# Patient Record
Sex: Male | Born: 1990 | Race: Black or African American | Hispanic: No | Marital: Single | Smoking: Current some day smoker
Health system: Southern US, Community
[De-identification: ages and names within clinical notes are randomized; demographics above are authoritative.]

## PROBLEM LIST (undated history)

## (undated) ENCOUNTER — Emergency Department (HOSPITAL_BASED_OUTPATIENT_CLINIC_OR_DEPARTMENT_OTHER): Admission: EM | Discharge status: Against Medical Advice | Payer: Self-pay

## (undated) DIAGNOSIS — I10 Essential (primary) hypertension: Secondary | ICD-10-CM

## (undated) DIAGNOSIS — F909 Attention-deficit hyperactivity disorder, unspecified type: Secondary | ICD-10-CM

## (undated) DIAGNOSIS — F419 Anxiety disorder, unspecified: Secondary | ICD-10-CM

---
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
## 2010-04-20 ENCOUNTER — Emergency Department (HOSPITAL_COMMUNITY): Admission: EM | Admit: 2010-04-20 | Discharge: 2010-04-20 | Payer: Self-pay | Admitting: Emergency Medicine

## 2010-10-28 ENCOUNTER — Emergency Department (HOSPITAL_COMMUNITY): Admission: EM | Admit: 2010-10-28 | Discharge: 2010-10-28 | Payer: Self-pay | Admitting: Emergency Medicine

## 2010-11-03 ENCOUNTER — Emergency Department (HOSPITAL_COMMUNITY): Admission: EM | Admit: 2010-11-03 | Discharge: 2010-11-03 | Payer: Self-pay | Admitting: Emergency Medicine

## 2011-09-05 LAB — URINALYSIS, ROUTINE W REFLEX MICROSCOPIC
Glucose, UA: NEGATIVE
Hgb urine dipstick: NEGATIVE
Protein, ur: NEGATIVE
Urobilinogen, UA: 1

## 2011-09-05 LAB — DIFFERENTIAL
Basophils Absolute: 0
Basophils Relative: 1
Eosinophils Relative: 1
Monocytes Absolute: 0.6
Monocytes Relative: 9

## 2011-09-05 LAB — COMPREHENSIVE METABOLIC PANEL
ALT: 19
AST: 31
Albumin: 4.8
Alkaline Phosphatase: 59
Chloride: 102
Potassium: 5.1
Sodium: 136
Total Bilirubin: 1.3 — ABNORMAL HIGH
Total Protein: 7.2

## 2011-09-05 LAB — CBC
HCT: 45.5
Platelets: 209
RDW: 14
WBC: 7

## 2012-05-25 ENCOUNTER — Emergency Department (HOSPITAL_COMMUNITY)
Admission: EM | Admit: 2012-05-25 | Discharge: 2012-05-25 | Disposition: A | Discharge status: Home / Self Care | Payer: BC Managed Care – PPO | Attending: Emergency Medicine | Admitting: Emergency Medicine

## 2012-05-25 ENCOUNTER — Encounter (HOSPITAL_COMMUNITY): Payer: Self-pay | Admitting: Emergency Medicine

## 2012-05-25 DIAGNOSIS — F17200 Nicotine dependence, unspecified, uncomplicated: Secondary | ICD-10-CM | POA: Insufficient documentation

## 2012-05-25 DIAGNOSIS — K047 Periapical abscess without sinus: Secondary | ICD-10-CM | POA: Insufficient documentation

## 2012-05-25 MED ORDER — TRAMADOL HCL 50 MG PO TABS: 50.0000 mg | ORAL_TABLET | Freq: Four times a day (QID) | ORAL | Status: AC | PRN

## 2012-05-25 MED ORDER — PENICILLIN V POTASSIUM 500 MG PO TABS: 500.0000 mg | ORAL_TABLET | Freq: Three times a day (TID) | ORAL | Status: AC

## 2012-05-25 NOTE — Discharge Instructions (Signed)
Dental Abscess A dental abscess usually starts from an infected tooth. Antibiotic medicine and pain pills can be helpful, but dental infections require the attention of a dentist. Rinse around the infected area often with salt water (a pinch of salt in 8 oz of warm water). Do not apply heat to the outside of your face. See your dentist or oral surgeon as soon as possible.  SEEK IMMEDIATE MEDICAL CARE IF:  You have increasing, severe pain that is not relieved by medicine.   You or your child has an oral temperature above 102 F (38.9 C), not controlled by medicine.   Your baby is older than 3 months with a rectal temperature of 102 F (38.9 C) or higher.   Your baby is 3 months old or younger with a rectal temperature of 100.4 F (38 C) or higher.   You develop chills, severe headache, difficulty breathing, or trouble swallowing.   You have swelling in the neck or around the eye.  Document Released: 11/25/2005 Document Revised: 11/14/2011 Document Reviewed: 05/06/2007 [employer] Patient Information 2012 [employer]. 

## 2012-05-25 NOTE — ED Provider Notes (Signed)
History     CSN: [id number]  Arrival date & time 05/25/12  1353   First MD Initiated Contact with Patient 05/25/12 1540      4:57 PM HPI Patient reports for several days has had significant dental pain. Reports he has noticed a small dental abscess forming above is a left lateral upper incisor. Denies fever, difficulty breathing or swallowing. Reports already has an appointment scheduled with his dentist but he is unable to wait any longer due to severe pain.  Patient is a 21 y.o. male presenting with tooth pain. The history is provided by the patient.  Dental PainThe primary symptoms include mouth pain and oral lesions (Dental abscess). Primary symptoms do not include oral bleeding or headaches. The symptoms began 3 to 5 days ago. The symptoms are worsening. The symptoms are new. The symptoms occur constantly.  Additional symptoms include: gum swelling and gum tenderness. Additional symptoms do not include: dental sensitivity to temperature, purulent gums, trismus, jaw pain, facial swelling, trouble swallowing, pain with swallowing, ear pain and hearing loss.    History reviewed. No pertinent past medical history.  History reviewed. No pertinent past surgical history.  No family history on file.  History  Substance Use Topics  . Smoking status: Current Some Day Smoker    Types: Cigars  . Smokeless tobacco: Not on file  . Alcohol Use: No      Review of Systems  HENT: Positive for dental problem. Negative for hearing loss, ear pain, facial swelling, mouth sores, trouble swallowing and neck stiffness.        Dental abscess in tooth ache.    Eyes: Negative for photophobia and pain.  Neurological: Negative for weakness and headaches.  All other systems reviewed and are negative.    Allergies  Review of patient's allergies indicates no known allergies.  Home Medications  No current outpatient prescriptions on file.  BP 132/56  Pulse 64  Temp 98.4 F (36.9 C) (Oral)   Resp 16  SpO2 99%  Physical Exam  Constitutional: He is oriented to person, place, and time. He appears well-developed and well-nourished.  HENT:  Head: Normocephalic and atraumatic.  Mouth/Throat: Oropharynx is clear and moist. No oropharyngeal exudate.    Eyes: Pupils are equal, round, and reactive to light.  Neck: Normal range of motion. Neck supple.  Lymphadenopathy:    He has no cervical adenopathy.  Neurological: He is alert and oriented to person, place, and time.  Skin: Skin is warm and dry. No rash noted. No erythema. No pallor.  Psychiatric: He has a normal mood and affect. His behavior is normal.    ED Course  Dental Date/Time: 05/25/2012 4:52 PM Performed by: Thomasene Lot Authorized by: Thomasene Lot Consent: Verbal consent obtained. Consent given by: patient Patient understanding: patient states understanding of the procedure being performed Patient identity confirmed: verbally with patient Local anesthesia used: yes Anesthesia: nerve block Local anesthetic: bupivacaine 0.5% with epinephrine Anesthetic total: 3 ml Patient sedated: no Patient tolerance: Patient tolerated the procedure well with no immediate complications. Comments: Needle aspiration of dental abscess. Nerve block of the left anterior superior alveolar nerve.       MDM          Thomasene Lot, PA-C 05/25/12 1659

## 2012-05-25 NOTE — ED Notes (Signed)
Pt presenting to ed with c/o dental pain upper left side x 3 days pt states causing difficulty with eating

## 2012-05-26 NOTE — ED Provider Notes (Signed)
Medical screening examination/treatment/procedure(s) were performed by non-physician practitioner and as supervising physician I was immediately available for consultation/collaboration.   Hurman Horn, MD 05/26/12 [phone]

## 2012-08-17 ENCOUNTER — Encounter (HOSPITAL_COMMUNITY): Payer: Self-pay | Admitting: Family Medicine

## 2012-08-17 ENCOUNTER — Emergency Department (HOSPITAL_COMMUNITY)
Admission: EM | Admit: 2012-08-17 | Discharge: 2012-08-17 | Disposition: A | Discharge status: Home / Self Care | Payer: No Typology Code available for payment source | Attending: Emergency Medicine | Admitting: Emergency Medicine

## 2012-08-17 DIAGNOSIS — M546 Pain in thoracic spine: Secondary | ICD-10-CM | POA: Insufficient documentation

## 2012-08-17 DIAGNOSIS — F17200 Nicotine dependence, unspecified, uncomplicated: Secondary | ICD-10-CM | POA: Insufficient documentation

## 2012-08-17 MED ORDER — CYCLOBENZAPRINE HCL 10 MG PO TABS: 10.0000 mg | ORAL_TABLET | Freq: Two times a day (BID) | ORAL | Status: AC | PRN

## 2012-08-17 MED ORDER — IBUPROFEN 600 MG PO TABS: 600.0000 mg | ORAL_TABLET | Freq: Four times a day (QID) | ORAL | Status: AC | PRN

## 2012-08-17 MED ORDER — IBUPROFEN 800 MG PO TABS
800.0000 mg | ORAL_TABLET | Freq: Once | ORAL | Status: AC
  Administered 2012-08-17: 800 mg via ORAL
  Filled 2012-08-17: qty 1
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
## 2012-08-20 NOTE — ED Provider Notes (Signed)
Medical screening examination/treatment/procedure(s) were performed by non-physician practitioner and as supervising physician I was immediately available for consultation/collaboration.  Shelda Jakes, MD 08/20/12 2258

## 2012-12-15 ENCOUNTER — Emergency Department (HOSPITAL_COMMUNITY)
Admission: EM | Admit: 2012-12-15 | Discharge: 2012-12-15 | Disposition: A | Discharge status: Home / Self Care | Payer: Self-pay | Attending: Emergency Medicine | Admitting: Emergency Medicine

## 2012-12-15 ENCOUNTER — Encounter (HOSPITAL_COMMUNITY): Payer: Self-pay | Admitting: Emergency Medicine

## 2012-12-15 DIAGNOSIS — F17200 Nicotine dependence, unspecified, uncomplicated: Secondary | ICD-10-CM | POA: Insufficient documentation

## 2012-12-15 DIAGNOSIS — K047 Periapical abscess without sinus: Secondary | ICD-10-CM | POA: Insufficient documentation

## 2012-12-15 DIAGNOSIS — I10 Essential (primary) hypertension: Secondary | ICD-10-CM | POA: Insufficient documentation

## 2012-12-15 DIAGNOSIS — F909 Attention-deficit hyperactivity disorder, unspecified type: Secondary | ICD-10-CM | POA: Insufficient documentation

## 2012-12-15 HISTORY — DX: Essential (primary) hypertension: I10

## 2012-12-15 HISTORY — DX: Attention-deficit hyperactivity disorder, unspecified type: F90.9

## 2012-12-15 MED ORDER — OXYCODONE-ACETAMINOPHEN 5-325 MG PO TABS: ORAL_TABLET | ORAL | Status: DC

## 2012-12-15 MED ORDER — PENICILLIN V POTASSIUM 500 MG PO TABS: 500.0000 mg | ORAL_TABLET | Freq: Four times a day (QID) | ORAL | Status: AC

## 2012-12-15 NOTE — ED Notes (Addendum)
Patient complaining of left sided dental pain that started today; pain has radiated upwards and caused a headache.  Patient states that he has swelling around his gums.  Reports cracked tooth on left side.

## 2012-12-15 NOTE — ED Provider Notes (Signed)
History     CSN: [id number]  Arrival date & time 12/15/12  0056   First MD Initiated Contact with Patient 12/15/12 0101      Chief Complaint  Patient presents with  . Dental Pain    (Consider location/radiation/quality/duration/timing/severity/associated sxs/prior treatment) HPI  Patient with tooth pain worsening over the course of the day and also abscess to frontal upper jaw. Denies fever, nausea vomiting  Past Medical History  Diagnosis Date  . ADHD (attention deficit hyperactivity disorder)   . Hypertension     History reviewed. No pertinent past surgical history.  History reviewed. No pertinent family history.  History  Substance Use Topics  . Smoking status: Current Some Day Smoker    Types: Cigars  . Smokeless tobacco: Not on file  . Alcohol Use: Yes      Review of Systems  Constitutional: Negative for fever.  HENT: Positive for dental problem.   Respiratory: Negative for shortness of breath.   Cardiovascular: Negative for chest pain.  Gastrointestinal: Negative for nausea, vomiting, abdominal pain and diarrhea.  All other systems reviewed and are negative.    Allergies  Review of patient's allergies indicates no known allergies.  Home Medications  No current outpatient prescriptions on file.  BP 135/73  Pulse 74  Temp 98.1 F (36.7 C) (Oral)  Resp 16  SpO2 98%  Physical Exam  Nursing note and vitals reviewed. Constitutional: He is oriented to person, place, and time. He appears well-developed and well-nourished. No distress.  HENT:  Head: Normocephalic.  Mouth/Throat:    Eyes: Conjunctivae normal and EOM are normal.  Cardiovascular: Normal rate.   Pulmonary/Chest: Effort normal. No stridor.  Musculoskeletal: Normal range of motion.  Neurological: He is alert and oriented to person, place, and time.  Psychiatric: He has a normal mood and affect.    ED Course  Procedures (including critical care time)  INCISION AND  DRAINAGE Performed by: Wynetta Emery Consent: Verbal consent obtained. Risks and benefits: risks, benefits and alternatives were discussed Type: abscess  Body area: gum  Anesthesia: local infiltration  Incision was made with a scalpel.  Local anesthetic: lidocaine 2% without epinephrine  Anesthetic total: Half ml  Drainage: purulent  Drainage amount: 4   Packing material: none Patient tolerance: Patient tolerated the procedure well with no immediate complications.    Labs Reviewed - No data to display No results found.   1. Dental abscess       MDM   Pt verbalized understanding and agrees with care plan. Outpatient follow-up and return precautions given.    New Prescriptions   OXYCODONE-ACETAMINOPHEN (PERCOCET/ROXICET) 5-325 MG PER TABLET    1 to 2 tabs PO q6hrs  PRN for pain   PENICILLIN V POTASSIUM (VEETID) 500 MG TABLET    Take 1 tablet (500 mg total) by mouth 4 (four) times daily.          Wynetta Emery, PA-C 12/15/12 [phone]

## 2012-12-16 NOTE — ED Provider Notes (Signed)
Medical screening examination/treatment/procedure(s) were performed by non-physician practitioner and as supervising physician I was immediately available for consultation/collaboration.   Laray Anger, DO 12/16/12 1028

## 2013-01-18 ENCOUNTER — Encounter (HOSPITAL_COMMUNITY): Payer: Self-pay | Admitting: Emergency Medicine

## 2013-01-18 ENCOUNTER — Emergency Department (HOSPITAL_COMMUNITY)
Admission: EM | Admit: 2013-01-18 | Discharge: 2013-01-18 | Disposition: A | Discharge status: Home / Self Care | Payer: BC Managed Care – PPO | Attending: Emergency Medicine | Admitting: Emergency Medicine

## 2013-01-18 DIAGNOSIS — K0889 Other specified disorders of teeth and supporting structures: Secondary | ICD-10-CM

## 2013-01-18 DIAGNOSIS — Z8659 Personal history of other mental and behavioral disorders: Secondary | ICD-10-CM | POA: Insufficient documentation

## 2013-01-18 DIAGNOSIS — I10 Essential (primary) hypertension: Secondary | ICD-10-CM | POA: Insufficient documentation

## 2013-01-18 DIAGNOSIS — F17200 Nicotine dependence, unspecified, uncomplicated: Secondary | ICD-10-CM | POA: Insufficient documentation

## 2013-01-18 DIAGNOSIS — K089 Disorder of teeth and supporting structures, unspecified: Secondary | ICD-10-CM | POA: Insufficient documentation

## 2013-01-18 HISTORY — DX: Anxiety disorder, unspecified: F41.9

## 2013-01-18 MED ORDER — PERCOCET 5-325 MG PO TABS: 1.0000 | ORAL_TABLET | Freq: Four times a day (QID) | ORAL | Status: DC | PRN

## 2013-01-18 MED ORDER — PENICILLIN V POTASSIUM 500 MG PO TABS: 500.0000 mg | ORAL_TABLET | Freq: Four times a day (QID) | ORAL | Status: DC

## 2013-01-18 MED ORDER — OXYCODONE-ACETAMINOPHEN 5-325 MG PO TABS
1.0000 | ORAL_TABLET | Freq: Once | ORAL | Status: AC
  Administered 2013-01-18: 1 via ORAL
  Filled 2013-01-18: qty 1

## 2013-01-18 NOTE — ED Notes (Signed)
Pt states "something busted in my mouth last night" and then had severe pain this am in upper left bicuspid. Tooth is broken.

## 2013-01-18 NOTE — ED Provider Notes (Signed)
Medical screening examination/treatment/procedure(s) were performed by non-physician practitioner and as supervising physician I was immediately available for consultation/collaboration.   Laray Anger, DO 01/18/13 2111

## 2013-01-18 NOTE — ED Provider Notes (Signed)
History     CSN: [id number]  Arrival date & time 01/18/13  4098   First MD Initiated Contact with Patient 01/18/13 0804      No chief complaint on file.   (Consider location/radiation/quality/duration/timing/severity/associated sxs/prior treatment) HPI Comments: Patient presents to the emergency department with a dental complaint. Symptoms began yesterday. The patient has tried to alleviate pain with Tylenol.  Pain rated at a 10/10, characterized as throbbing in nature and located left upper incisor. Patient denies fever, night sweats, chills, difficulty swallowing or opening mouth, SOB, nuchal rigidity or decreased ROM of neck.  Patient does not have a dentist and requests a resource guide at discharge.   The history is provided by the patient.    Past Medical History  Diagnosis Date  . ADHD (attention deficit hyperactivity disorder)   . Hypertension   . Anxiety     History reviewed. No pertinent past surgical history.  No family history on file.  History  Substance Use Topics  . Smoking status: Current Some Day Smoker    Types: Cigars  . Smokeless tobacco: Not on file  . Alcohol Use: Yes      Review of Systems  Constitutional: Negative for fever, chills, diaphoresis and activity change.  HENT: Positive for dental problem. Negative for ear pain, sore throat, facial swelling, drooling, mouth sores, trouble swallowing, neck pain, neck stiffness, voice change, sinus pressure and tinnitus.   Eyes: Negative for pain and visual disturbance.  Respiratory: Negative for shortness of breath, wheezing and stridor.   Cardiovascular: Negative for chest pain.  Gastrointestinal: Negative for nausea and abdominal pain.  Musculoskeletal: Negative for myalgias.  Skin: Negative for rash.  Neurological: Negative for speech difficulty and headaches.  Hematological: Negative for adenopathy.  All other systems reviewed and are negative.    Allergies  Ibuprofen  Home Medications    Current Outpatient Rx  Name  Route  Sig  Dispense  Refill  . ibuprofen (ADVIL,MOTRIN) 200 MG tablet   Oral   Take 800 mg by mouth every 6 (six) hours as needed for pain (for pain).           BP 132/73  Pulse 84  Temp(Src) 97 F (36.1 C) (Oral)  SpO2 100%  Physical Exam  Nursing note and vitals reviewed. Constitutional: He is oriented to person, place, and time. He appears well-developed and well-nourished. No distress.  HENT:  Head: Normocephalic and atraumatic. No trismus in the jaw.  Mouth/Throat: Uvula is midline, oropharynx is clear and moist and mucous membranes are normal. Abnormal dentition. No dental abscesses or edematous. No oropharyngeal exudate, posterior oropharyngeal edema, posterior oropharyngeal erythema or tonsillar abscesses.  Poor dental hygiene. Pt able to open and close mouth with out difficulty. Airway intact. Uvula midline. Mild gingival swelling with tenderness over affected area, but no fluctuance. No swelling or tenderness of submental and submandibular regions.  Eyes: Conjunctivae and EOM are normal.  Neck: Normal range of motion and full passive range of motion without pain. Neck supple.  Cardiovascular: Normal rate and regular rhythm.   Pulmonary/Chest: Effort normal and breath sounds normal. No stridor. No respiratory distress. He has no wheezes.  Musculoskeletal: Normal range of motion.  Lymphadenopathy:       Head (right side): No submental, no submandibular, no tonsillar, no preauricular and no posterior auricular adenopathy present.       Head (left side): No submental, no submandibular, no tonsillar, no preauricular and no posterior auricular adenopathy present.    He has  no cervical adenopathy.  Neurological: He is alert and oriented to person, place, and time.  Skin: Skin is warm and dry. No rash noted. He is not diaphoretic.    ED Course  Dental Date/Time: 01/18/2013 8:21 AM Performed by: Jaci Carrel Authorized by: Jaci Carrel Consent: Verbal consent obtained. Risks and benefits: risks, benefits and alternatives were discussed Consent given by: patient Patient understanding: patient states understanding of the procedure being performed Patient consent: the patient's understanding of the procedure matches consent given Patient identity confirmed: verbally with patient and arm band Local anesthesia used: yes Local anesthetic: bupivacaine 0.5% with epinephrine Anesthetic total: 1.8 ml Patient sedated: no Comments: Dental block for pain   (including critical care time)  Labs Reviewed - No data to display No results found.   No diagnosis found.    MDM  Patient with toothache.  No gross abscess.  Exam unconcerning for Ludwig's angina or spread of infection. Dental block performed in ED for pain relief, tolerated well.  Will treat with penicillin and pain medicine.  Urged patient to follow-up with dentist.           Jaci Carrel, PA-C 01/18/13 [phone]

## 2013-02-14 ENCOUNTER — Encounter (HOSPITAL_COMMUNITY): Payer: Self-pay | Admitting: Emergency Medicine

## 2013-02-14 ENCOUNTER — Emergency Department (HOSPITAL_COMMUNITY): Payer: BC Managed Care – PPO

## 2013-02-14 ENCOUNTER — Emergency Department (HOSPITAL_COMMUNITY)
Admission: EM | Admit: 2013-02-14 | Discharge: 2013-02-14 | Disposition: A | Discharge status: Home / Self Care | Payer: BC Managed Care – PPO | Attending: Emergency Medicine | Admitting: Emergency Medicine

## 2013-02-14 DIAGNOSIS — F17200 Nicotine dependence, unspecified, uncomplicated: Secondary | ICD-10-CM | POA: Insufficient documentation

## 2013-02-14 DIAGNOSIS — S62307A Unspecified fracture of fifth metacarpal bone, left hand, initial encounter for closed fracture: Secondary | ICD-10-CM

## 2013-02-14 DIAGNOSIS — S62339A Displaced fracture of neck of unspecified metacarpal bone, initial encounter for closed fracture: Secondary | ICD-10-CM | POA: Insufficient documentation

## 2013-02-14 DIAGNOSIS — Z8781 Personal history of (healed) traumatic fracture: Secondary | ICD-10-CM | POA: Insufficient documentation

## 2013-02-14 DIAGNOSIS — Z8659 Personal history of other mental and behavioral disorders: Secondary | ICD-10-CM | POA: Insufficient documentation

## 2013-02-14 DIAGNOSIS — I10 Essential (primary) hypertension: Secondary | ICD-10-CM | POA: Insufficient documentation

## 2013-02-14 MED ORDER — HYDROCODONE-ACETAMINOPHEN 5-325 MG PO TABS: 1.0000 | ORAL_TABLET | ORAL | Status: DC | PRN

## 2013-02-14 MED ORDER — HYDROCODONE-ACETAMINOPHEN 5-325 MG PO TABS
2.0000 | ORAL_TABLET | Freq: Once | ORAL | Status: AC
  Administered 2013-02-14: 2 via ORAL
  Filled 2013-02-14: qty 2

## 2013-02-14 NOTE — Progress Notes (Signed)
Orthopedic Tech Progress Note Patient Details:  Bradley Arias 06/19/1991 [id number]  Ortho Devices Type of Ortho Device: Ace wrap;Ulna gutter splint Ortho Device/Splint Location: (L) UE   [name] 02/14/2013, 7:14 PM
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
## 2013-02-14 NOTE — ED Provider Notes (Signed)
Medical screening examination/treatment/procedure(s) were performed by non-physician practitioner and as supervising physician I was immediately available for consultation/collaboration.   Whitney Plunkett, MD 02/14/13 2314 
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
## 2013-02-15 ENCOUNTER — Emergency Department (HOSPITAL_COMMUNITY)
Admission: EM | Admit: 2013-02-15 | Discharge: 2013-02-15 | Disposition: A | Discharge status: Home / Self Care | Payer: BC Managed Care – PPO | Attending: Emergency Medicine | Admitting: Emergency Medicine

## 2013-02-15 ENCOUNTER — Encounter (HOSPITAL_COMMUNITY): Payer: Self-pay | Admitting: *Deleted

## 2013-02-15 DIAGNOSIS — F17200 Nicotine dependence, unspecified, uncomplicated: Secondary | ICD-10-CM | POA: Insufficient documentation

## 2013-02-15 DIAGNOSIS — I10 Essential (primary) hypertension: Secondary | ICD-10-CM | POA: Insufficient documentation

## 2013-02-15 DIAGNOSIS — K047 Periapical abscess without sinus: Secondary | ICD-10-CM | POA: Insufficient documentation

## 2013-02-15 DIAGNOSIS — F411 Generalized anxiety disorder: Secondary | ICD-10-CM | POA: Insufficient documentation

## 2013-02-15 DIAGNOSIS — F909 Attention-deficit hyperactivity disorder, unspecified type: Secondary | ICD-10-CM | POA: Insufficient documentation

## 2013-02-15 MED ORDER — AMOXICILLIN 500 MG PO CAPS: 1000.0000 mg | ORAL_CAPSULE | Freq: Two times a day (BID) | ORAL | Status: DC

## 2013-02-15 NOTE — ED Provider Notes (Signed)
History  This chart was scribed for Dione Booze, MD by Shari Heritage, ED Scribe. The patient was seen in room TR06C/TR06C. Patient's care was started at 1919.   CSN: [id number]  Arrival date & time 02/15/13  1613   First MD Initiated Contact with Patient 02/15/13 1919      Chief Complaint  Patient presents with  . Dental Pain     The history is provided by the patient. No language interpreter was used.    HPI Comments: Bradley Arias is a 22 y.o. male who presents to the Emergency Department complaining of dental abscess to the left upper gum. Patient states that he woke up with this problem today. He says that the abscess ruptured while here in the waiting room. There is no fever, chills, nausea or vomiting. He is a current some day smoker.    Past Medical History  Diagnosis Date  . ADHD (attention deficit hyperactivity disorder)   . Hypertension   . Anxiety     History reviewed. No pertinent past surgical history.  No family history on file.  History  Substance Use Topics  . Smoking status: Current Some Day Smoker    Types: Cigars  . Smokeless tobacco: Not on file  . Alcohol Use: Yes      Review of Systems  Constitutional: Negative for chills.  HENT: Positive for dental problem.   Gastrointestinal: Negative for nausea and vomiting.    Allergies  Ibuprofen  Home Medications   Current Outpatient Rx  Name  Route  Sig  Dispense  Refill  . HYDROcodone-acetaminophen (NORCO/VICODIN) 5-325 MG per tablet   Oral   Take 1 tablet by mouth every 4 (four) hours as needed for pain.           Triage Vitals: BP 124/76  Pulse 90  Temp(Src) 99.5 F (37.5 C) (Oral)  Resp 18  SpO2 94%  Physical Exam  Constitutional: He is oriented to person, place, and time. He appears well-developed and well-nourished.  HENT:  Head: Normocephalic and atraumatic.  Mouth/Throat: Dental abscesses present.  Site of recent abscess draining on gingiva between teeth 10 and 11. No  tenderness to percussion.  Neck: Normal range of motion. Neck supple.  Musculoskeletal: Normal range of motion.  Neurological: He is alert and oriented to person, place, and time.  Skin: Skin is warm and dry.  Psychiatric: He has a normal mood and affect. His behavior is normal.    ED Course  Procedures (including critical care time) DIAGNOSTIC STUDIES: Oxygen Saturation is 94% on room air, adequate by my interpretation.    COORDINATION OF CARE: 7:30 PM- Patient informed of current plan for treatment and evaluation and agrees with plan at this time.    1. Dental abscess       MDM  Dental abscess which has spontaneously drained. Old records are reviewed and he has 3 other ED visits for pain in the same tooth. He is referred to the on-call dentist for further evaluation to see if additional drainage is needed for possible root canal. He is discharged with prescription for amoxicillin.   I personally performed the services described in this documentation, which was scribed in my presence. The recorded information has been reviewed and is accurate.     Dione Booze, MD 02/15/13 [phone]

## 2013-02-15 NOTE — ED Notes (Signed)
Called with no response

## 2013-02-15 NOTE — ED Notes (Signed)
Called again with no response

## 2013-02-15 NOTE — ED Notes (Signed)
Pt st's he woke up this am with abscess above left upper tooth.  Pt st's it drained while he was in waiting room but tooth continues to be sore.

## 2013-02-15 NOTE — ED Notes (Signed)
Pt is here with left upper gum abscess

## 2013-02-16 ENCOUNTER — Telehealth (HOSPITAL_COMMUNITY): Payer: Self-pay | Admitting: Emergency Medicine

## 2013-02-16 NOTE — ED Notes (Signed)
Pharmav=cy called for clarification of Rx for Amoxicillin directions called in last night.  Order reviewed w/ RPh.

## 2013-02-16 NOTE — ED Notes (Signed)
Patient called 02/15/2013 stating that he had misplaced Rx for antibiotic and ask if we could call in. Rx : Amoxicillin 500 mg (2) capsules by mouth two times daily..called to pharmarcy.

## 2013-08-03 ENCOUNTER — Encounter (HOSPITAL_COMMUNITY): Payer: Self-pay | Admitting: *Deleted

## 2013-08-03 ENCOUNTER — Emergency Department (HOSPITAL_COMMUNITY)
Admission: EM | Admit: 2013-08-03 | Discharge: 2013-08-03 | Disposition: A | Discharge status: Home / Self Care | Payer: No Typology Code available for payment source | Attending: Emergency Medicine | Admitting: Emergency Medicine

## 2013-08-03 DIAGNOSIS — K0889 Other specified disorders of teeth and supporting structures: Secondary | ICD-10-CM

## 2013-08-03 DIAGNOSIS — F17200 Nicotine dependence, unspecified, uncomplicated: Secondary | ICD-10-CM | POA: Insufficient documentation

## 2013-08-03 DIAGNOSIS — I10 Essential (primary) hypertension: Secondary | ICD-10-CM | POA: Insufficient documentation

## 2013-08-03 DIAGNOSIS — Z8659 Personal history of other mental and behavioral disorders: Secondary | ICD-10-CM | POA: Insufficient documentation

## 2013-08-03 DIAGNOSIS — R220 Localized swelling, mass and lump, head: Secondary | ICD-10-CM | POA: Insufficient documentation

## 2013-08-03 DIAGNOSIS — K089 Disorder of teeth and supporting structures, unspecified: Secondary | ICD-10-CM | POA: Insufficient documentation

## 2013-08-03 MED ORDER — HYDROCODONE-ACETAMINOPHEN 5-325 MG PO TABS: 1.0000 | ORAL_TABLET | Freq: Four times a day (QID) | ORAL | Status: DC | PRN

## 2013-08-03 MED ORDER — PENICILLIN V POTASSIUM 500 MG PO TABS: 500.0000 mg | ORAL_TABLET | Freq: Four times a day (QID) | ORAL | Status: AC

## 2013-08-03 NOTE — ED Notes (Signed)
PT had abscess bust to left upper front tooth and patient states he is in agony.

## 2013-08-03 NOTE — ED Provider Notes (Signed)
CSN: [id number]     Arrival date & time 08/03/13  1324 History   First MD Initiated Contact with Patient 08/03/13 1344     Chief Complaint  Patient presents with  . Dental Pain   (Consider location/radiation/quality/duration/timing/severity/associated sxs/prior Treatment) HPI Comments: Patient presents with a chief complaint of dental pain.  He reports that the pain is located in the area of the left upper incisor tooth.  He reports that the pain worsened today.  This morning he noticed some swelling of the gingiva and reports that some yellow pus drained from the gingiva.  He has been taking Ibuprofen and Advil for the ain without relief.  He currently does not have a dentist.    Patient is a 22 y.o. male presenting with tooth pain. The history is provided by the patient.  Dental Pain Associated symptoms: gum swelling   Associated symptoms: no difficulty swallowing, no drooling, no facial pain, no facial swelling, no fever, no neck pain, no neck swelling and no trismus     Past Medical History  Diagnosis Date  . ADHD (attention deficit hyperactivity disorder)   . Hypertension   . Anxiety    History reviewed. No pertinent past surgical history. No family history on file. History  Substance Use Topics  . Smoking status: Current Some Day Smoker    Types: Cigars  . Smokeless tobacco: Not on file  . Alcohol Use: Yes    Review of Systems  Constitutional: Negative for fever.  HENT: Positive for dental problem. Negative for facial swelling, drooling and neck pain.   All other systems reviewed and are negative.    Allergies  Ibuprofen  Home Medications  No current outpatient prescriptions on file. BP 144/73  Pulse 70  Temp(Src) 98.6 F (37 C) (Oral)  Resp 18  SpO2 97% Physical Exam  Nursing note and vitals reviewed. Constitutional: He is oriented to person, place, and time. He appears well-developed and well-nourished. No distress.  HENT:  Head: Normocephalic and  atraumatic.  Mouth/Throat: Uvula is midline, oropharynx is clear and moist and mucous membranes are normal. No trismus in the jaw. Abnormal dentition. No dental abscesses or edematous. No oropharyngeal exudate, posterior oropharyngeal edema, posterior oropharyngeal erythema or tonsillar abscesses.  Poor dental hygiene. Pt able to open and close mouth with out difficulty. Airway intact. Uvula midline. Mild gingival swelling with tenderness over affected area, but no fluctuance. No swelling or tenderness of submental and submandibular regions.  Eyes: Conjunctivae and EOM are normal.  Neck: Normal range of motion and full passive range of motion without pain. Neck supple.  Cardiovascular: Normal rate, regular rhythm and normal heart sounds.   Pulmonary/Chest: Effort normal and breath sounds normal. No stridor. No respiratory distress. He has no wheezes.  Musculoskeletal: Normal range of motion.  Lymphadenopathy:       Head (right side): No submental, no submandibular, no tonsillar, no preauricular and no posterior auricular adenopathy present.       Head (left side): No submental, no submandibular, no tonsillar, no preauricular and no posterior auricular adenopathy present.    He has no cervical adenopathy.  Neurological: He is alert and oriented to person, place, and time.  Skin: Skin is warm and dry. No rash noted. He is not diaphoretic.    ED Course  Procedures (including critical care time) Labs Review Labs Reviewed - No data to display Imaging Review No results found.  MDM   1. Pain, dental    Patient with toothache.  No  gross abscess.  Exam unconcerning for Ludwig's angina or spread of infection.  Will treat with penicillin and pain medicine.  Urged patient to follow-up with dentist.      Pascal Lux Byers, PA-C 08/04/13 1208

## 2013-08-04 NOTE — ED Provider Notes (Signed)
Medical screening examination/treatment/procedure(s) were performed by non-physician practitioner and as supervising physician I was immediately available for consultation/collaboration.   Laray Anger, DO 08/04/13 2106

## 2013-10-07 ENCOUNTER — Encounter (HOSPITAL_COMMUNITY): Payer: Self-pay | Admitting: Emergency Medicine

## 2013-10-07 ENCOUNTER — Emergency Department (HOSPITAL_COMMUNITY)
Admission: EM | Admit: 2013-10-07 | Discharge: 2013-10-07 | Disposition: A | Discharge status: Home / Self Care | Payer: No Typology Code available for payment source | Attending: Emergency Medicine | Admitting: Emergency Medicine

## 2013-10-07 DIAGNOSIS — F17200 Nicotine dependence, unspecified, uncomplicated: Secondary | ICD-10-CM | POA: Insufficient documentation

## 2013-10-07 DIAGNOSIS — Z8659 Personal history of other mental and behavioral disorders: Secondary | ICD-10-CM | POA: Insufficient documentation

## 2013-10-07 DIAGNOSIS — K089 Disorder of teeth and supporting structures, unspecified: Secondary | ICD-10-CM | POA: Insufficient documentation

## 2013-10-07 DIAGNOSIS — K0889 Other specified disorders of teeth and supporting structures: Secondary | ICD-10-CM

## 2013-10-07 DIAGNOSIS — R109 Unspecified abdominal pain: Secondary | ICD-10-CM | POA: Insufficient documentation

## 2013-10-07 DIAGNOSIS — I10 Essential (primary) hypertension: Secondary | ICD-10-CM | POA: Insufficient documentation

## 2013-10-07 MED ORDER — HYDROCODONE-ACETAMINOPHEN 5-325 MG PO TABS: 1.0000 | ORAL_TABLET | Freq: Four times a day (QID) | ORAL | Status: DC | PRN

## 2013-10-07 MED ORDER — HYDROCODONE-ACETAMINOPHEN 5-325 MG PO TABS: 1.0000 | ORAL_TABLET | Freq: Four times a day (QID) | ORAL | Status: AC | PRN

## 2013-10-07 MED ORDER — AMOXICILLIN 500 MG PO CAPS: 500.0000 mg | ORAL_CAPSULE | Freq: Three times a day (TID) | ORAL | Status: DC

## 2013-10-07 MED ORDER — HYDROCODONE-ACETAMINOPHEN 5-325 MG PO TABS
1.0000 | ORAL_TABLET | Freq: Once | ORAL | Status: AC
  Administered 2013-10-07: 1 via ORAL
  Filled 2013-10-07: qty 1

## 2013-10-07 NOTE — ED Notes (Signed)
Pt c/o left upper dental pain x 3 days from broken tooth; pt sts some nausea due to pain

## 2013-10-07 NOTE — ED Provider Notes (Signed)
CSN: [id number]     Arrival date & time 10/07/13  0840 History   First MD Initiated Contact with Patient 10/07/13 [phone]     Chief Complaint  Patient presents with  . Dental Pain   (Consider location/radiation/quality/duration/timing/severity/associated sxs/prior Treatment) HPI Bradley Arias is a 22 y.o. male who presents emergency department complaining of abdominal pain. States he broke his tooth yesterday, states pain since then. Took Excedrin Migraine with no relief. States history of dental problems. States his called his dentist but could not get an appointment. Patient states he has history of abscesses and worried that this will become one. Patient denies any fever, chills, facial swelling. No other complaints.  Past Medical History  Diagnosis Date  . ADHD (attention deficit hyperactivity disorder)   . Hypertension   . Anxiety    History reviewed. No pertinent past surgical history. History reviewed. No pertinent family history. History  Substance Use Topics  . Smoking status: Current Some Day Smoker    Types: Cigars  . Smokeless tobacco: Not on file  . Alcohol Use: Yes    Review of Systems  Constitutional: Negative for fever and chills.  HENT: Positive for dental problem. Negative for mouth sores and sore throat.   Respiratory: Negative for cough, chest tightness and shortness of breath.   Cardiovascular: Negative for chest pain, palpitations and leg swelling.  Musculoskeletal: Negative for arthralgias, myalgias, neck pain and neck stiffness.  Skin: Negative for rash.  Allergic/Immunologic: Negative for immunocompromised state.  Neurological: Negative for dizziness and headaches.    Allergies  Ibuprofen  Home Medications  No current outpatient prescriptions on file. BP 143/69  Pulse 61  Temp(Src) 98 F (36.7 C) (Oral)  Resp 18  Ht 6\' 1"  (1.854 m)  Wt 175 lb (79.379 kg)  BMI 23.09 kg/m2  SpO2 98% Physical Exam  Nursing note and vitals  reviewed. Constitutional: He is oriented to person, place, and time. He appears well-developed and well-nourished. No distress.  HENT:  Head: Normocephalic.  No facial swelling. No obvious tooth fracture noted. Tenderness over left upper lateral incisor and over the surrounding gum. No obvious abscess. No trismus. No swelling under the tongue  Eyes: Conjunctivae are normal.  Neck: Neck supple.  Cardiovascular: Normal rate, regular rhythm and normal heart sounds.   Pulmonary/Chest: Effort normal and breath sounds normal. No respiratory distress. He has no wheezes. He has no rales.  Neurological: He is alert and oriented to person, place, and time.  Skin: Skin is warm and dry.    ED Course  Procedures (including critical care time) Labs Review Labs Reviewed - No data to display Imaging Review No results found.  EKG Interpretation   None       MDM   1. Pain, dental     Patient with a dental pain. At this time no obvious abscess. He does have a dentist but cannot get appointment this time. We'll start amoxicillin, Norco for pain, followup with [facility] as able.  Filed Vitals:   10/07/13 0853  BP: 143/69  Pulse: 61  Temp: 98 F (36.7 C)  TempSrc: Oral  Resp: 18  Height: 6\' 1"  (1.854 m)  Weight: 175 lb (79.379 kg)  SpO2: 98%       Lottie Mussel, PA-C 10/07/13 1516

## 2013-10-10 NOTE — ED Provider Notes (Signed)
Medical screening examination/treatment/procedure(s) were performed by non-physician practitioner and as supervising physician I was immediately available for consultation/collaboration.  Charistopher Rumble M Michaeal Davis, MD 10/10/13 1907 

## 2020-09-18 ENCOUNTER — Emergency Department (HOSPITAL_COMMUNITY)
Admission: EM | Admit: 2020-09-18 | Discharge: 2020-09-18 | Disposition: A | Discharge status: Home / Self Care | Payer: BC Managed Care – PPO | Attending: Emergency Medicine | Admitting: Emergency Medicine

## 2020-09-18 ENCOUNTER — Encounter (HOSPITAL_COMMUNITY): Payer: Self-pay

## 2020-09-18 DIAGNOSIS — I10 Essential (primary) hypertension: Secondary | ICD-10-CM | POA: Insufficient documentation

## 2020-09-18 DIAGNOSIS — R1032 Left lower quadrant pain: Secondary | ICD-10-CM | POA: Insufficient documentation

## 2020-09-18 DIAGNOSIS — R197 Diarrhea, unspecified: Secondary | ICD-10-CM | POA: Insufficient documentation

## 2020-09-18 DIAGNOSIS — K259 Gastric ulcer, unspecified as acute or chronic, without hemorrhage or perforation: Secondary | ICD-10-CM | POA: Insufficient documentation

## 2020-09-18 DIAGNOSIS — F17290 Nicotine dependence, other tobacco product, uncomplicated: Secondary | ICD-10-CM | POA: Insufficient documentation

## 2020-09-18 DIAGNOSIS — R1084 Generalized abdominal pain: Secondary | ICD-10-CM

## 2020-09-18 DIAGNOSIS — R112 Nausea with vomiting, unspecified: Secondary | ICD-10-CM | POA: Insufficient documentation

## 2020-09-18 LAB — COMPREHENSIVE METABOLIC PANEL
ALT: 19 U/L (ref 0–44)
AST: 21 U/L (ref 15–41)
Albumin: 5.2 g/dL — ABNORMAL HIGH (ref 3.5–5.0)
Alkaline Phosphatase: 77 U/L (ref 38–126)
Anion gap: 15 (ref 5–15)
BUN: 13 mg/dL (ref 6–20)
CO2: 28 mmol/L (ref 22–32)
Calcium: 10.3 mg/dL (ref 8.9–10.3)
Chloride: 98 mmol/L (ref 98–111)
Creatinine, Ser: 1.29 mg/dL — ABNORMAL HIGH (ref 0.61–1.24)
GFR, Estimated: >60 mL/min (ref >60)
Glucose, Bld: 109 mg/dL — ABNORMAL HIGH (ref 70–99)
Potassium: 4.3 mmol/L (ref 3.5–5.1)
Sodium: 141 mmol/L (ref 135–145)
Total Bilirubin: 1.4 mg/dL — ABNORMAL HIGH (ref 0.3–1.2)
Total Protein: 8.6 g/dL — ABNORMAL HIGH (ref 6.5–8.1)

## 2020-09-18 LAB — URINALYSIS, ROUTINE W REFLEX MICROSCOPIC
Bilirubin Urine: NEGATIVE
Glucose, UA: NEGATIVE mg/dL
Hgb urine dipstick: NEGATIVE
Ketones, ur: 80 mg/dL — AB
Nitrite: NEGATIVE
Protein, ur: 30 mg/dL — AB
Specific Gravity, Urine: 1.03 (ref 1.005–1.030)
WBC, UA: >50 WBC/hpf — ABNORMAL HIGH (ref 0–5)
pH: 5 (ref 5.0–8.0)

## 2020-09-18 LAB — CBC
HCT: 46.9 % (ref 39.0–52.0)
Hemoglobin: 15.9 g/dL (ref 13.0–17.0)
MCH: 31.9 pg (ref 26.0–34.0)
MCHC: 33.9 g/dL (ref 30.0–36.0)
MCV: 94 fL (ref 80.0–100.0)
Platelets: 289 10*3/uL (ref 150–400)
RBC: 4.99 MIL/uL (ref 4.22–5.81)
RDW: 14.2 % (ref 11.5–15.5)
WBC: 8.5 10*3/uL (ref 4.0–10.5)
nRBC: 0 % (ref 0.0–0.2)

## 2020-09-18 LAB — LIPASE, BLOOD: Lipase: 26 U/L (ref 11–51)

## 2020-09-18 LAB — ETHANOL: Alcohol, Ethyl (B): <10 mg/dL (ref <10)

## 2020-09-18 MED ORDER — DICYCLOMINE HCL 20 MG PO TABS: 20.0000 mg | ORAL_TABLET | Freq: Two times a day (BID) | ORAL | 0 refills | Status: AC

## 2020-09-18 MED ORDER — DICYCLOMINE HCL 10 MG/ML IM SOLN
20.0000 mg | Freq: Once | INTRAMUSCULAR | Status: AC
  Administered 2020-09-18: 20 mg via INTRAMUSCULAR
  Filled 2020-09-18: qty 2

## 2020-09-18 MED ORDER — SODIUM CHLORIDE 0.9 % IV BOLUS
1000.0000 mL | Freq: Once | INTRAVENOUS | Status: AC
  Administered 2020-09-18: 1000 mL via INTRAVENOUS

## 2020-09-18 MED ORDER — SUCRALFATE 1 GM/10ML PO SUSP
1.0000 g | Freq: Once | ORAL | Status: AC
  Administered 2020-09-18: 1 g via ORAL
  Filled 2020-09-18: qty 10

## 2020-09-18 MED ORDER — PANTOPRAZOLE SODIUM 40 MG IV SOLR
40.0000 mg | Freq: Once | INTRAVENOUS | Status: AC
  Administered 2020-09-18: 40 mg via INTRAVENOUS
  Filled 2020-09-18: qty 40

## 2020-09-18 MED ORDER — ONDANSETRON 4 MG PO TBDP: 4.0000 mg | ORAL_TABLET | Freq: Three times a day (TID) | ORAL | 0 refills | Status: DC | PRN

## 2020-09-18 NOTE — ED Provider Notes (Signed)
[facility]-EMERGENCY DEPT Provider Note   CSN: [id number] Arrival date & time: 09/18/20  0840     History Chief Complaint  Patient presents with  . Abdominal Pain    Bradley Arias is a 29 y.o. male.  HPI    Adult male with a history of gastric ulcers now presents with concern for left upper quadrant abdominal pain, nausea, vomiting, and diarrhea. He notes that he has previously been diagnosed with gastric ulcers, but has never seen a gastroenterologist in spite of being advised to do so. He currently takes no medication regularly for his episodic abdominal pain.  He also does not currently take his medicine for ADHD.  The patient recently relocated back to this area, home, and notes that over the past few days he has had more alcohol than usual.  During this time he has had pain in his left upper abdomen, epigastrium, sore, moderate. There is associated anorexia, nausea, vomiting, diarrhea. No fever, no dyspnea, chest pain. Minimal relief with Tums and Tylenol.    Past Medical History:  Diagnosis Date  . ADHD (attention deficit hyperactivity disorder)   . Anxiety   . Hypertension     Patient Active Problem List   Diagnosis Date Noted  . ADHD (attention deficit hyperactivity disorder)     History reviewed. No pertinent surgical history.     No family history on file.  Social History   Tobacco Use  . Smoking status: Current Some Day Smoker    Types: Cigars  . Smokeless tobacco: Never Used  Substance Use Topics  . Alcohol use: Yes  . Drug use: No    Home Medications Prior to Admission medications   Medication Sig Start Date End Date Taking? Authorizing Provider  amoxicillin (AMOXIL) 500 MG capsule Take 1 capsule (500 mg total) by mouth 3 (three) times daily. 10/07/13   Kirichenko, Lemont Fillers, PA-C  HYDROcodone-acetaminophen (NORCO) 5-325 MG per tablet Take 1 tablet by mouth every 6 (six) hours as needed for pain. 10/07/13    Kirichenko, Lemont Fillers, PA-C    Allergies    Ibuprofen  Review of Systems   Review of Systems  Constitutional:       Per HPI, otherwise negative  HENT:       Per HPI, otherwise negative  Respiratory:       Per HPI, otherwise negative  Cardiovascular:       Per HPI, otherwise negative  Gastrointestinal: Positive for abdominal pain, diarrhea, nausea and vomiting.  Endocrine:       Negative aside from HPI  Genitourinary:       Neg aside from HPI   Musculoskeletal:       Per HPI, otherwise negative  Skin: Negative.   Neurological: Negative for syncope.    Physical Exam Updated Vital Signs BP 130/88 (BP Location: Left Arm)   Pulse (!) 59   Temp 99.2 F (37.3 C) (Oral)   Resp 18   Ht 6\' 1"  (1.854 m)   Wt 79.4 kg   SpO2 100%   BMI 23.09 kg/m   Physical Exam Vitals and nursing note reviewed.  Constitutional:      General: He is not in acute distress.    Appearance: He is well-developed.  HENT:     Head: Normocephalic and atraumatic.  Eyes:     Conjunctiva/sclera: Conjunctivae normal.  Cardiovascular:     Rate and Rhythm: Normal rate and regular rhythm.  Pulmonary:     Effort: Pulmonary effort is normal. No respiratory  distress.     Breath sounds: No stridor.  Abdominal:     General: There is no distension.     Comments: Minimal tenderness to palpation, no guarding, no rebound.  Patient notes area of greatest discomfort in his left lateral, epigastric.  Skin:    General: Skin is warm and dry.  Neurological:     Mental Status: He is alert and oriented to person, place, and time.     ED Results / Procedures / Treatments   Labs (all labs ordered are listed, but only abnormal results are displayed) Labs Reviewed  COMPREHENSIVE METABOLIC PANEL - Abnormal; Notable for the following components:      Result Value   Glucose, Bld 109 (*)    Creatinine, Ser 1.29 (*)    Total Protein 8.6 (*)    Albumin 5.2 (*)    Total Bilirubin 1.4 (*)    All other components  within normal limits  LIPASE, BLOOD  CBC  ETHANOL  URINALYSIS, ROUTINE W REFLEX MICROSCOPIC    EKG None  Radiology No results found.  Procedures Procedures (including critical care time)  Medications Ordered in ED Medications  sodium chloride 0.9 % bolus 1,000 mL (1,000 mLs Intravenous New Bag/Given 09/18/20 0939)  sucralfate (CARAFATE) 1 GM/10ML suspension 1 g (1 g Oral Given 09/18/20 0939)  pantoprazole (PROTONIX) injection 40 mg (40 mg Intravenous Given 09/18/20 3419)    ED Course  I have reviewed the triage vital signs and the nursing notes.  Pertinent labs & imaging results that were available during my care of the patient were reviewed by me and considered in my medical decision making (see chart for details).  10:08 AM Labs notable for slight elevation in creatinine.  Patient has received fluids, Carafate, IV pantoprazole.  Update:, Patient in no distress.  I reviewed all findings, notable for absence of leukocytosis, substantial electrolyte abnormalities beyond evidence for dehydration.  1:51 PM Patient improved, sitting upright, notes his crampiness has improved.  Adult male reported history of gastric ulcers presents with upper abdominal pain, nausea, vomiting.  On here he is awake, alert, with soft, nonperitoneal abdomen.  He is afebrile, has no demonstrated a inability to tolerate p.o., received fluids, meds, has reduction in symptoms. Patient appropriate for follow-up with our gastroenterology colleagues, and initiation of appropriate therapy.  Final Clinical Impression(s) / ED Diagnoses Final diagnoses:  Generalized abdominal pain  Nausea vomiting and diarrhea    Rx / DC Orders ED Discharge Orders         Ordered    dicyclomine (BENTYL) 20 MG tablet  2 times daily        09/18/20 1352    ondansetron (ZOFRAN ODT) 4 MG disintegrating tablet  Every 8 hours PRN        09/18/20 1352           Gerhard Munch, MD 09/18/20 1352

## 2020-09-18 NOTE — Discharge Instructions (Signed)
As discussed, your evaluation today has been largely reassuring.  But, it is important that you monitor your condition carefully, and do not hesitate to return to the ED if you develop new, or concerning changes in your condition. ? ?Otherwise, please follow-up with your physician for appropriate ongoing care. ? ?

## 2020-09-18 NOTE — ED Triage Notes (Signed)
Patient arrived via [facility] from home.   C/O cramping, N/V, and diarrhea.   7/10 pain   Hx. Gastric Ulcers   A/ox4

## 2020-09-18 NOTE — ED Notes (Signed)
Pt left before vitals could be taken

## 2020-10-13 ENCOUNTER — Ambulatory Visit: Payer: Self-pay | Admitting: Gastroenterology

## 2021-01-26 ENCOUNTER — Emergency Department (HOSPITAL_COMMUNITY)
Admission: EM | Admit: 2021-01-26 | Discharge: 2021-01-27 | Disposition: A | Discharge status: Home / Self Care | Payer: Self-pay | Attending: Emergency Medicine | Admitting: Emergency Medicine

## 2021-01-26 ENCOUNTER — Other Ambulatory Visit: Payer: Self-pay

## 2021-01-26 ENCOUNTER — Encounter (HOSPITAL_COMMUNITY): Payer: Self-pay | Admitting: Emergency Medicine

## 2021-01-26 DIAGNOSIS — N4829 Other inflammatory disorders of penis: Secondary | ICD-10-CM | POA: Insufficient documentation

## 2021-01-26 DIAGNOSIS — Z5321 Procedure and treatment not carried out due to patient leaving prior to being seen by health care provider: Secondary | ICD-10-CM | POA: Insufficient documentation

## 2021-01-26 NOTE — ED Notes (Signed)
Pt states that he does not wish to wait. Pt seen leaving through ED entrance.

## 2021-01-26 NOTE — ED Triage Notes (Signed)
Pt arrives to ED with penis pain that he states he noticed yesterday when he woke up in his car after drinking and smoking. He did have a hole in his pants and boxers where he dropped a cigarette..but he is not sure if this is a burn or a rash.he describes it has multiple bumps with burning.

## 2021-07-06 ENCOUNTER — Other Ambulatory Visit: Payer: Self-pay

## 2021-07-06 ENCOUNTER — Emergency Department (HOSPITAL_COMMUNITY)
Admission: EM | Admit: 2021-07-06 | Discharge: 2021-07-07 | Disposition: A | Discharge status: Home / Self Care | Payer: Self-pay | Attending: Emergency Medicine | Admitting: Emergency Medicine

## 2021-07-06 ENCOUNTER — Encounter (HOSPITAL_COMMUNITY): Payer: Self-pay | Admitting: Emergency Medicine

## 2021-07-06 ENCOUNTER — Encounter (HOSPITAL_COMMUNITY): Payer: Self-pay

## 2021-07-06 ENCOUNTER — Emergency Department (HOSPITAL_COMMUNITY): Payer: Self-pay

## 2021-07-06 ENCOUNTER — Emergency Department (HOSPITAL_COMMUNITY)
Admission: EM | Admit: 2021-07-06 | Discharge: 2021-07-06 | Disposition: A | Discharge status: Home / Self Care | Payer: Self-pay | Attending: Physician Assistant | Admitting: Physician Assistant

## 2021-07-06 DIAGNOSIS — R109 Unspecified abdominal pain: Secondary | ICD-10-CM | POA: Insufficient documentation

## 2021-07-06 DIAGNOSIS — R112 Nausea with vomiting, unspecified: Secondary | ICD-10-CM | POA: Insufficient documentation

## 2021-07-06 DIAGNOSIS — R197 Diarrhea, unspecified: Secondary | ICD-10-CM | POA: Insufficient documentation

## 2021-07-06 DIAGNOSIS — Z5321 Procedure and treatment not carried out due to patient leaving prior to being seen by health care provider: Secondary | ICD-10-CM | POA: Insufficient documentation

## 2021-07-06 DIAGNOSIS — R079 Chest pain, unspecified: Secondary | ICD-10-CM | POA: Insufficient documentation

## 2021-07-06 DIAGNOSIS — R451 Restlessness and agitation: Secondary | ICD-10-CM | POA: Insufficient documentation

## 2021-07-06 LAB — CBC WITH DIFFERENTIAL/PLATELET
Abs Immature Granulocytes: 0.03 10*3/uL (ref 0.00–0.07)
Basophils Absolute: 0 10*3/uL (ref 0.0–0.1)
Basophils Relative: 1 %
Eosinophils Absolute: 0.1 10*3/uL (ref 0.0–0.5)
Eosinophils Relative: 2 %
HCT: 46.1 % (ref 39.0–52.0)
Hemoglobin: 16.1 g/dL (ref 13.0–17.0)
Immature Granulocytes: 1 %
Lymphocytes Relative: 26 %
Lymphs Abs: 1.4 10*3/uL (ref 0.7–4.0)
MCH: 31.3 pg (ref 26.0–34.0)
MCHC: 34.9 g/dL (ref 30.0–36.0)
MCV: 89.5 fL (ref 80.0–100.0)
Monocytes Absolute: 0.5 10*3/uL (ref 0.1–1.0)
Monocytes Relative: 9 %
Neutro Abs: 3.4 10*3/uL (ref 1.7–7.7)
Neutrophils Relative %: 61 %
Platelets: 299 10*3/uL (ref 150–400)
RBC: 5.15 MIL/uL (ref 4.22–5.81)
RDW: 13.2 % (ref 11.5–15.5)
WBC: 5.4 10*3/uL (ref 4.0–10.5)
nRBC: 0 % (ref 0.0–0.2)

## 2021-07-06 LAB — COMPREHENSIVE METABOLIC PANEL
ALT: 12 U/L (ref 0–44)
AST: 25 U/L (ref 15–41)
Albumin: 4.7 g/dL (ref 3.5–5.0)
Alkaline Phosphatase: 69 U/L (ref 38–126)
Anion gap: 12 (ref 5–15)
BUN: 15 mg/dL (ref 6–20)
CO2: 24 mmol/L (ref 22–32)
Calcium: 9.6 mg/dL (ref 8.9–10.3)
Chloride: 99 mmol/L (ref 98–111)
Creatinine, Ser: 1.28 mg/dL — ABNORMAL HIGH (ref 0.61–1.24)
GFR, Estimated: >60 mL/min (ref >60)
Glucose, Bld: 83 mg/dL (ref 70–99)
Potassium: 3 mmol/L — ABNORMAL LOW (ref 3.5–5.1)
Sodium: 135 mmol/L (ref 135–145)
Total Bilirubin: 1.2 mg/dL (ref 0.3–1.2)
Total Protein: 8.1 g/dL (ref 6.5–8.1)

## 2021-07-06 LAB — LIPASE, BLOOD: Lipase: 25 U/L (ref 11–51)

## 2021-07-06 LAB — TROPONIN I (HIGH SENSITIVITY): Troponin I (High Sensitivity): 5 ng/L (ref <18)

## 2021-07-06 NOTE — ED Notes (Signed)
Attempted to receive vital signs but patient removed blood pressure cuff and pulse ox before being able to get them.

## 2021-07-06 NOTE — ED Triage Notes (Addendum)
Patient arrives ambulatory with EMS irate and agitated. Patient loud, cursing and hostile, uncooperative. Patient complaining of chest pain, but patient is too agitated to get further information. Patient seen today at [facility] but left before evaluation was completed. Patient continuing to threaten to "show his ass" because it "got him a bed last time." Patient threatening to lock himself in the bathroom if he can't get a bed. Patient informed that we do not have a bed for him, and his behavior towards staff will not be tolerated. Unable to complete triage due to patient hostility. Patient escorted to lobby by security.

## 2021-07-06 NOTE — ED Provider Notes (Signed)
Emergency Medicine Provider Triage Evaluation Note  Bradley Arias , a 30 y.o. male  was evaluated in triage.  Pt complains of nvd, abd pain and chest pain. States hx ulcers and this feels the same.  Review of Systems  Positive: Nvd, abd pain, chest pain Negative: fever  Physical Exam  There were no vitals taken for this visit. Gen:   Awake, no distress   Resp:  Normal effort  MSK:   Moves extremities without difficulty  Other:    Medical Decision Making  Medically screening exam initiated at 12:45 PM.  Appropriate orders placed.  Bradley Arias was informed that the remainder of the evaluation will be completed by another provider, this initial triage assessment does not replace that evaluation, and the importance of remaining in the ED until their evaluation is complete.     Karrie Meres, PA-C 07/06/21 1246    Cathren Laine, MD 07/07/21 1259

## 2021-07-06 NOTE — ED Triage Notes (Signed)
Pt from home with ems for emesis, known gastric ulcers. Emesis x1 en route with ems. Also c.o diarrhea. Pt a.o

## 2021-07-06 NOTE — ED Notes (Signed)
Called pt to go back, no response.

## 2021-07-07 ENCOUNTER — Encounter (HOSPITAL_BASED_OUTPATIENT_CLINIC_OR_DEPARTMENT_OTHER): Payer: Self-pay

## 2021-07-07 ENCOUNTER — Emergency Department (HOSPITAL_BASED_OUTPATIENT_CLINIC_OR_DEPARTMENT_OTHER): Payer: Self-pay

## 2021-07-07 ENCOUNTER — Emergency Department (HOSPITAL_BASED_OUTPATIENT_CLINIC_OR_DEPARTMENT_OTHER)
Admission: EM | Admit: 2021-07-07 | Discharge: 2021-07-07 | Disposition: A | Discharge status: Home / Self Care | Payer: Self-pay | Attending: Emergency Medicine | Admitting: Emergency Medicine

## 2021-07-07 ENCOUNTER — Emergency Department (HOSPITAL_BASED_OUTPATIENT_CLINIC_OR_DEPARTMENT_OTHER): Payer: Self-pay | Admitting: Radiology

## 2021-07-07 DIAGNOSIS — K2900 Acute gastritis without bleeding: Secondary | ICD-10-CM | POA: Insufficient documentation

## 2021-07-07 DIAGNOSIS — R002 Palpitations: Secondary | ICD-10-CM | POA: Insufficient documentation

## 2021-07-07 DIAGNOSIS — I10 Essential (primary) hypertension: Secondary | ICD-10-CM | POA: Insufficient documentation

## 2021-07-07 DIAGNOSIS — R197 Diarrhea, unspecified: Secondary | ICD-10-CM | POA: Insufficient documentation

## 2021-07-07 DIAGNOSIS — F17290 Nicotine dependence, other tobacco product, uncomplicated: Secondary | ICD-10-CM | POA: Insufficient documentation

## 2021-07-07 LAB — HEPATIC FUNCTION PANEL
ALT: 14 U/L (ref 0–44)
AST: 24 U/L (ref 15–41)
Albumin: 5.4 g/dL — ABNORMAL HIGH (ref 3.5–5.0)
Alkaline Phosphatase: 67 U/L (ref 38–126)
Bilirubin, Direct: 0.1 mg/dL (ref 0.0–0.2)
Indirect Bilirubin: 0.6 mg/dL (ref 0.3–0.9)
Total Bilirubin: 0.7 mg/dL (ref 0.3–1.2)
Total Protein: 9 g/dL — ABNORMAL HIGH (ref 6.5–8.1)

## 2021-07-07 LAB — CBC
HCT: 44.9 % (ref 39.0–52.0)
Hemoglobin: 15.8 g/dL (ref 13.0–17.0)
MCH: 30.6 pg (ref 26.0–34.0)
MCHC: 35.2 g/dL (ref 30.0–36.0)
MCV: 86.8 fL (ref 80.0–100.0)
Platelets: 303 10*3/uL (ref 150–400)
RBC: 5.17 MIL/uL (ref 4.22–5.81)
RDW: 13.2 % (ref 11.5–15.5)
WBC: 7.9 10*3/uL (ref 4.0–10.5)
nRBC: 0 % (ref 0.0–0.2)

## 2021-07-07 LAB — BASIC METABOLIC PANEL
Anion gap: 15 (ref 5–15)
BUN: 26 mg/dL — ABNORMAL HIGH (ref 6–20)
CO2: 28 mmol/L (ref 22–32)
Calcium: 10.1 mg/dL (ref 8.9–10.3)
Chloride: 94 mmol/L — ABNORMAL LOW (ref 98–111)
Creatinine, Ser: 1.16 mg/dL (ref 0.61–1.24)
GFR, Estimated: >60 mL/min (ref >60)
Glucose, Bld: 94 mg/dL (ref 70–99)
Potassium: 3.6 mmol/L (ref 3.5–5.1)
Sodium: 137 mmol/L (ref 135–145)

## 2021-07-07 LAB — LIPASE, BLOOD: Lipase: 11 U/L (ref 11–51)

## 2021-07-07 LAB — TROPONIN I (HIGH SENSITIVITY): Troponin I (High Sensitivity): 5 ng/L (ref <18)

## 2021-07-07 MED ORDER — ONDANSETRON HCL 4 MG/2ML IJ SOLN
4.0000 mg | Freq: Once | INTRAMUSCULAR | Status: AC
  Administered 2021-07-07: 4 mg via INTRAVENOUS
  Filled 2021-07-07: qty 2

## 2021-07-07 MED ORDER — SODIUM CHLORIDE 0.9 % IV BOLUS: 1000.0000 mL | Freq: Once | INTRAVENOUS | Status: DC

## 2021-07-07 MED ORDER — ONDANSETRON 4 MG PO TBDP: 4.0000 mg | ORAL_TABLET | Freq: Three times a day (TID) | ORAL | 0 refills | Status: DC | PRN

## 2021-07-07 MED ORDER — ONDANSETRON 8 MG PO TBDP: 8.0000 mg | ORAL_TABLET | Freq: Three times a day (TID) | ORAL | 0 refills | Status: DC | PRN

## 2021-07-07 MED ORDER — PANTOPRAZOLE SODIUM 20 MG PO TBEC: 20.0000 mg | DELAYED_RELEASE_TABLET | Freq: Every day | ORAL | 0 refills | Status: AC

## 2021-07-07 MED ORDER — IOHEXOL 300 MG/ML  SOLN
75.0000 mL | Freq: Once | INTRAMUSCULAR | Status: AC | PRN
  Administered 2021-07-07: 75 mL via INTRAVENOUS

## 2021-07-07 MED ORDER — PANTOPRAZOLE SODIUM 40 MG IV SOLR
40.0000 mg | Freq: Once | INTRAVENOUS | Status: AC
  Administered 2021-07-07: 40 mg via INTRAVENOUS
  Filled 2021-07-07: qty 40

## 2021-07-07 NOTE — ED Notes (Signed)
Dc home with belongings. Reviewed dc instructions with patient and verbalized understanding.

## 2021-07-07 NOTE — ED Triage Notes (Signed)
Pt. C/o left-sided chest pain, which at times is "sharp". His skin is normal, warm and dry and he is breathing normally.

## 2021-07-07 NOTE — ED Provider Notes (Signed)
[facility] EMERGENCY DEPT Provider Note   CSN: [id number] Arrival date & time: 07/07/21  7253     History Chief Complaint  Patient presents with   Chest Pain    Bradley Arias is a 30 y.o. male.  HPI 30 yo male complaining of 2 days of chest pressure, palpitations, nausea and vomiting.  Vomiting and diarrhea, chest pain.  Subjective fever and chills.  Patient went to [facility] and [place] and eventually left and came to [facility].  Last vomited, no intervention in waiting room. Patient states he feels dehydrated.  Patient with similar symptoms in past 2 years ago and told to follow up with family physician and did not. Patient is smoker, no longer drinks etoh.  HO "bleeding ulcers" Does not currently take any meds.      Past Medical History:  Diagnosis Date   ADHD (attention deficit hyperactivity disorder)    Anxiety    Hypertension     Patient Active Problem List   Diagnosis Date Noted   ADHD (attention deficit hyperactivity disorder)     No past surgical history on file.     No family history on file.  Social History   Tobacco Use   Smoking status: Some Days    Types: Cigars   Smokeless tobacco: Never  Substance Use Topics   Alcohol use: Yes   Drug use: No    Home Medications Prior to Admission medications   Medication Sig Start Date End Date Taking? Authorizing Provider  dicyclomine (BENTYL) 20 MG tablet Take 1 tablet (20 mg total) by mouth 2 (two) times daily. 09/18/20   Gerhard Munch, MD  HYDROcodone-acetaminophen (NORCO) 5-325 MG per tablet Take 1 tablet by mouth every 6 (six) hours as needed for pain. Patient not taking: Reported on 09/18/2020 10/07/13   Jaynie Crumble, PA-C  ondansetron (ZOFRAN ODT) 4 MG disintegrating tablet Take 1 tablet (4 mg total) by mouth every 8 (eight) hours as needed for nausea or vomiting. 09/18/20   Gerhard Munch, MD    Allergies    Ibuprofen  Review of Systems   Review of Systems   Constitutional: Negative.   Eyes: Negative.   Respiratory:  Negative for cough and choking.   Cardiovascular:  Positive for chest pain.  Gastrointestinal:  Positive for vomiting.  Endocrine: Negative.   Genitourinary: Negative.   Musculoskeletal: Negative.   Allergic/Immunologic: Negative.   Neurological: Negative.   Hematological: Negative.   Psychiatric/Behavioral: Negative.    All other systems reviewed and are negative.  Physical Exam Updated Vital Signs There were no vitals taken for this visit.  Physical Exam Vitals and nursing note reviewed.  Constitutional:      Appearance: He is well-developed.  HENT:     Head: Normocephalic and atraumatic.     Right Ear: External ear normal.     Left Ear: External ear normal.     Nose: Nose normal.     Mouth/Throat:     Mouth: Mucous membranes are moist.     Pharynx: Oropharynx is clear.  Eyes:     Extraocular Movements: Extraocular movements intact.     Conjunctiva/sclera: Conjunctivae normal.     Pupils: Pupils are equal, round, and reactive to light.  Cardiovascular:     Rate and Rhythm: Normal rate and regular rhythm.     Heart sounds: Normal heart sounds.  Pulmonary:     Effort: Pulmonary effort is normal. No respiratory distress.     Breath sounds: Normal breath sounds. No wheezing.  Chest:     Chest wall: No tenderness.  Abdominal:     General: Bowel sounds are normal. There is no distension.     Palpations: Abdomen is soft. There is no mass.     Tenderness: There is no abdominal tenderness. There is no guarding.  Musculoskeletal:        General: Normal range of motion.     Cervical back: Normal range of motion and neck supple.  Skin:    General: Skin is warm and dry.  Neurological:     Mental Status: He is alert and oriented to person, place, and time.     Motor: No abnormal muscle tone.     Coordination: Coordination normal.     Deep Tendon Reflexes: Reflexes are normal and symmetric.  Psychiatric:         Behavior: Behavior normal.        Thought Content: Thought content normal.        Judgment: Judgment normal.    ED Results / Procedures / Treatments   Labs (all labs ordered are listed, but only abnormal results are displayed) Labs Reviewed  BASIC METABOLIC PANEL  CBC  TROPONIN I (HIGH SENSITIVITY)    EKG EKG Interpretation  Date/Time:  Saturday July 07 2021 09:11:32 EDT Ventricular Rate:  106 PR Interval:  190 QRS Duration: 86 QT Interval:  336 QTC Calculation: 446 R Axis:   98 Text Interpretation: Sinus tachycardia Right atrial enlargement Rightward axis Abnormal ECG Confirmed by Margarita Grizzle [phone]) on 07/07/2021 11:47:27 AM  Radiology DG Chest 2 View  Result Date: 07/07/2021 CLINICAL DATA:  Lambert Mody left-sided chest pain. EXAM: CHEST - 2 VIEW COMPARISON:  Chest radiograph July 06, 2021. FINDINGS: Normal cardiac and mediastinal contours. No consolidative pulmonary opacities. No pleural effusion or pneumothorax. Osseous structures are unremarkable. IMPRESSION: No active cardiopulmonary disease. Electronically Signed   By: Annia Belt M.D.   On: 07/07/2021 10:22   DG Chest 2 View  Result Date: 07/06/2021 CLINICAL DATA:  Chest pain. EXAM: CHEST - 2 VIEW COMPARISON:  None. FINDINGS: The heart size and mediastinal contours are within normal limits. Both lungs are clear. The visualized skeletal structures are unremarkable. Negative for a pneumothorax. IMPRESSION: No active cardiopulmonary disease. Electronically Signed   By: Richarda Overlie M.D.   On: 07/06/2021 13:34   CT ABDOMEN PELVIS W CONTRAST  Result Date: 07/07/2021 CLINICAL DATA:  Left-sided abdominal pain.  Left-sided chest pain. EXAM: CT ABDOMEN AND PELVIS WITH CONTRAST TECHNIQUE: Multidetector CT imaging of the abdomen and pelvis was performed using the standard protocol following bolus administration of intravenous contrast. CONTRAST:  37mL OMNIPAQUE IOHEXOL 300 MG/ML  SOLN COMPARISON:  Plain film chest of earlier today.  Abdominal ultrasound 06/13/2008. FINDINGS: Lower chest: Clear lung bases. Normal heart size without pericardial or pleural effusion. No basilar pneumothorax. Hepatobiliary: Tiny low-density liver lesions are likely cysts and bile duct hamartomas. No suspicious liver lesion. Normal gallbladder, without biliary ductal dilatation. Pancreas: Normal, without mass or ductal dilatation. Spleen: Normal in size, without focal abnormality. Adrenals/Urinary Tract: Normal adrenal glands. Too small to characterize upper pole left renal lesion. Normal right kidney. No hydronephrosis. Decompressed urinary bladder. Stomach/Bowel: The gastric antrum appears thick walled on 21/2 but is underdistended. Both large and small bowel loops are underdistended and there is no enteric opacification. The colon is grossly normal. The appendix is not confidently identified. Normal small bowel caliber. Vascular/Lymphatic: Aortic atherosclerosis. No abdominopelvic adenopathy. Reproductive: Normal prostate. Other: No significant free fluid.  No free  intraperitoneal air. Musculoskeletal: Degenerative disc disease at the lumbosacral junction is mild. IMPRESSION: 1. Suboptimal evaluation of bowel, secondary to underdistention and lack of enteric contrast. 2. Apparent gastric antral wall thickening is favored to be due to underdistention. Recommend clinical exclusion of gastritis. 3. Otherwise, no explanation for patient's symptoms. Electronically Signed   By: Jeronimo Greaves M.D.   On: 07/07/2021 11:34    Procedures Procedures   Medications Ordered in ED Medications - No data to display  ED Course  I have reviewed the triage vital signs and the nursing notes.  Pertinent labs & imaging results that were available during my care of the patient were reviewed by me and considered in my medical decision making (see chart for details).    MDM Rules/Calculators/A&P                           30 year old male presents with epigastric pain,  nausea, and vomiting.  He reports a history of ulcer disease.  Here CT obtained with gastric antral wall thickening thought to be due to underdistention but could be gastritis.  He is given Zofran and Protonix and is tolerated p.o.  Plan outpatient Protonix and Zofran.  Patient given referral to GI for follow-up.  We discussed return precautions and need for follow-up and voices understanding. Final Clinical Impression(s) / ED Diagnoses Final diagnoses:  Acute gastritis without hemorrhage, unspecified gastritis type    Rx / DC Orders ED Discharge Orders     None        Margarita Grizzle, MD 07/07/21 1149

## 2021-07-07 NOTE — ED Notes (Signed)
Continued to attempted to get vital signs but patient is refusing. Complaining about vital signs and blood work not being done. Explained to pt that we attempted to get vitals signs and blood work when he first arrived and asked him multiple times to be done but continues to refuse.

## 2022-04-22 ENCOUNTER — Emergency Department (HOSPITAL_BASED_OUTPATIENT_CLINIC_OR_DEPARTMENT_OTHER): Payer: Self-pay

## 2022-04-22 ENCOUNTER — Encounter (HOSPITAL_BASED_OUTPATIENT_CLINIC_OR_DEPARTMENT_OTHER): Payer: Self-pay | Admitting: Obstetrics and Gynecology

## 2022-04-22 ENCOUNTER — Emergency Department (HOSPITAL_BASED_OUTPATIENT_CLINIC_OR_DEPARTMENT_OTHER)
Admission: EM | Admit: 2022-04-22 | Discharge: 2022-04-22 | Disposition: A | Discharge status: Home / Self Care | Payer: Self-pay | Attending: Emergency Medicine | Admitting: Emergency Medicine

## 2022-04-22 ENCOUNTER — Other Ambulatory Visit: Payer: Self-pay

## 2022-04-22 ENCOUNTER — Other Ambulatory Visit (HOSPITAL_BASED_OUTPATIENT_CLINIC_OR_DEPARTMENT_OTHER): Payer: Self-pay

## 2022-04-22 DIAGNOSIS — J189 Pneumonia, unspecified organism: Secondary | ICD-10-CM | POA: Insufficient documentation

## 2022-04-22 DIAGNOSIS — Z20822 Contact with and (suspected) exposure to covid-19: Secondary | ICD-10-CM | POA: Insufficient documentation

## 2022-04-22 DIAGNOSIS — Z87891 Personal history of nicotine dependence: Secondary | ICD-10-CM | POA: Insufficient documentation

## 2022-04-22 LAB — RESP PANEL BY RT-PCR (FLU A&B, COVID) ARPGX2
Influenza A by PCR: NEGATIVE
Influenza B by PCR: NEGATIVE
SARS Coronavirus 2 by RT PCR: NEGATIVE

## 2022-04-22 LAB — CBC WITH DIFFERENTIAL/PLATELET
Abs Immature Granulocytes: 0 10*3/uL (ref 0.00–0.07)
Band Neutrophils: 5 %
Basophils Absolute: 0 10*3/uL (ref 0.0–0.1)
Basophils Relative: 0 %
Eosinophils Absolute: 0 10*3/uL (ref 0.0–0.5)
Eosinophils Relative: 0 %
HCT: 40.3 % (ref 39.0–52.0)
Hemoglobin: 14 g/dL (ref 13.0–17.0)
Lymphocytes Relative: 5 %
Lymphs Abs: 0.5 10*3/uL — ABNORMAL LOW (ref 0.7–4.0)
MCH: 30.7 pg (ref 26.0–34.0)
MCHC: 34.7 g/dL (ref 30.0–36.0)
MCV: 88.4 fL (ref 80.0–100.0)
Monocytes Absolute: 0.8 10*3/uL (ref 0.1–1.0)
Monocytes Relative: 7 %
Neutro Abs: 9.5 10*3/uL — ABNORMAL HIGH (ref 1.7–7.7)
Neutrophils Relative %: 83 %
Platelets: 204 10*3/uL (ref 150–400)
RBC: 4.56 MIL/uL (ref 4.22–5.81)
RDW: 14.2 % (ref 11.5–15.5)
WBC: 10.8 10*3/uL — ABNORMAL HIGH (ref 4.0–10.5)
nRBC: 0 % (ref 0.0–0.2)

## 2022-04-22 LAB — URINALYSIS, ROUTINE W REFLEX MICROSCOPIC
Bilirubin Urine: NEGATIVE
Glucose, UA: NEGATIVE mg/dL
Ketones, ur: >80 mg/dL — AB
Leukocytes,Ua: NEGATIVE
Nitrite: NEGATIVE
Protein, ur: 100 mg/dL — AB
Specific Gravity, Urine: 1.02 (ref 1.005–1.030)
pH: 6 (ref 5.0–8.0)

## 2022-04-22 LAB — COMPREHENSIVE METABOLIC PANEL
ALT: 9 U/L (ref 0–44)
AST: 14 U/L — ABNORMAL LOW (ref 15–41)
Albumin: 3.7 g/dL (ref 3.5–5.0)
Alkaline Phosphatase: 64 U/L (ref 38–126)
Anion gap: 14 (ref 5–15)
BUN: 13 mg/dL (ref 6–20)
CO2: 25 mmol/L (ref 22–32)
Calcium: 9.4 mg/dL (ref 8.9–10.3)
Chloride: 92 mmol/L — ABNORMAL LOW (ref 98–111)
Creatinine, Ser: 1.17 mg/dL (ref 0.61–1.24)
GFR, Estimated: >60 mL/min (ref >60)
Glucose, Bld: 101 mg/dL — ABNORMAL HIGH (ref 70–99)
Potassium: 3.4 mmol/L — ABNORMAL LOW (ref 3.5–5.1)
Sodium: 131 mmol/L — ABNORMAL LOW (ref 135–145)
Total Bilirubin: 0.5 mg/dL (ref 0.3–1.2)
Total Protein: 7.7 g/dL (ref 6.5–8.1)

## 2022-04-22 LAB — LIPASE, BLOOD: Lipase: 12 U/L (ref 11–51)

## 2022-04-22 MED ORDER — DOXYCYCLINE HYCLATE 100 MG PO CAPS
100.0000 mg | ORAL_CAPSULE | Freq: Two times a day (BID) | ORAL | 0 refills | Status: DC
  Filled 2022-04-22: qty 20, 10d supply, fill #0

## 2022-04-22 MED ORDER — SODIUM CHLORIDE 0.9 % IV BOLUS
1000.0000 mL | Freq: Once | INTRAVENOUS | Status: AC
  Administered 2022-04-22: 1000 mL via INTRAVENOUS

## 2022-04-22 MED ORDER — DOXYCYCLINE HYCLATE 100 MG PO TABS
100.0000 mg | ORAL_TABLET | Freq: Once | ORAL | Status: AC
  Administered 2022-04-22: 100 mg via ORAL
  Filled 2022-04-22: qty 1

## 2022-04-22 MED ORDER — ONDANSETRON HCL 4 MG PO TABS
4.0000 mg | ORAL_TABLET | Freq: Four times a day (QID) | ORAL | 0 refills | Status: DC
  Filled 2022-04-22: qty 12, 3d supply, fill #0

## 2022-04-22 MED ORDER — ONDANSETRON HCL 4 MG/2ML IJ SOLN
4.0000 mg | Freq: Once | INTRAMUSCULAR | Status: AC
  Administered 2022-04-22: 4 mg via INTRAVENOUS
  Filled 2022-04-22: qty 2

## 2022-04-22 NOTE — ED Provider Notes (Signed)
?[facility] EMERGENCY DEPT ?Provider Note ? ? ?CSN: [id number] ?Arrival date & time: 04/22/22  1146 ? ?  ? ?History ? ?Chief Complaint  ?Patient presents with  ? Generalized Body Aches  ? ? ?Bradley Arias is a 31 y.o. male. ? ?Right footPatient here with body aches, cough, and pain.  Denies any nausea or vomiting.  No significant medical history.  Symptoms for the last several days.  Nothing makes it worse or better.  Smoking history.  Denies any recent alcohol or drug use.  Denies any shortness of breath but has had a bad cough but no sputum production. ? ? ? ?  ? ?Home Medications ?Prior to Admission medications   ?Medication Sig Start Date End Date Taking? Authorizing Provider  ?doxycycline (VIBRAMYCIN) 100 MG capsule Take 1 capsule (100 mg total) by mouth 2 (two) times daily. 04/22/22  Yes Kris No, DO  ?ondansetron (ZOFRAN) 4 MG tablet Take 1 tablet (4 mg total) by mouth every 6 (six) hours. 04/22/22  Yes Renee Beale, DO  ?dicyclomine (BENTYL) 20 MG tablet Take 1 tablet (20 mg total) by mouth 2 (two) times daily. 09/18/20   Gerhard Munch, MD  ?HYDROcodone-acetaminophen (NORCO) 5-325 MG per tablet Take 1 tablet by mouth every 6 (six) hours as needed for pain. ?Patient not taking: Reported on 09/18/2020 10/07/13   Jaynie Crumble, PA-C  ?ondansetron (ZOFRAN ODT) 4 MG disintegrating tablet Take 1 tablet (4 mg total) by mouth every 8 (eight) hours as needed for nausea or vomiting. 07/07/21   Margarita Grizzle, MD  ?ondansetron (ZOFRAN ODT) 8 MG disintegrating tablet Take 1 tablet (8 mg total) by mouth every 8 (eight) hours as needed for nausea or vomiting. 07/07/21   Margarita Grizzle, MD  ?pantoprazole (PROTONIX) 20 MG tablet Take 1 tablet (20 mg total) by mouth daily. 07/07/21   Margarita Grizzle, MD  ?   ? ?Allergies    ?Ibuprofen   ? ?Review of Systems   ?Review of Systems ? ?Physical Exam ?Updated Vital Signs ?BP 125/76   Pulse 81   Temp 98.6 ?F (37 ?C)   Resp 20   Ht 6\' 1"  (1.854 m)    Wt 79.4 kg   SpO2 97%   BMI 23.09 kg/m?  ?Physical Exam ?Vitals and nursing note reviewed.  ?Constitutional:   ?   General: He is not in acute distress. ?   Appearance: He is well-developed. He is not ill-appearing.  ?HENT:  ?   Head: Normocephalic and atraumatic.  ?   Nose: Nose normal.  ?   Mouth/Throat:  ?   Mouth: Mucous membranes are moist.  ?Eyes:  ?   Extraocular Movements: Extraocular movements intact.  ?   Conjunctiva/sclera: Conjunctivae normal.  ?   Pupils: Pupils are equal, round, and reactive to light.  ?Cardiovascular:  ?   Rate and Rhythm: Normal rate and regular rhythm.  ?   Pulses: Normal pulses.  ?   Heart sounds: Normal heart sounds. No murmur heard. ?Pulmonary:  ?   Effort: Pulmonary effort is normal. No respiratory distress.  ?   Breath sounds: Normal breath sounds.  ?Abdominal:  ?   Palpations: Abdomen is soft.  ?   Tenderness: There is no abdominal tenderness.  ?Musculoskeletal:     ?   General: No swelling.  ?   Cervical back: Normal range of motion and neck supple.  ?Skin: ?   General: Skin is warm and dry.  ?   Capillary Refill: Capillary refill takes less  than 2 seconds.  ?Neurological:  ?   General: No focal deficit present.  ?   Mental Status: He is alert.  ?Psychiatric:     ?   Mood and Affect: Mood normal.  ? ? ?ED Results / Procedures / Treatments   ?Labs ?(all labs ordered are listed, but only abnormal results are displayed) ?Labs Reviewed  ?CBC WITH DIFFERENTIAL/PLATELET - Abnormal; Notable for the following components:  ?    Result Value  ? WBC 10.8 (*)   ? Neutro Abs 9.5 (*)   ? Lymphs Abs 0.5 (*)   ? All other components within normal limits  ?COMPREHENSIVE METABOLIC PANEL - Abnormal; Notable for the following components:  ? Sodium 131 (*)   ? Potassium 3.4 (*)   ? Chloride 92 (*)   ? Glucose, Bld 101 (*)   ? AST 14 (*)   ? All other components within normal limits  ?URINALYSIS, ROUTINE W REFLEX MICROSCOPIC - Abnormal; Notable for the following components:  ? Hgb urine dipstick  TRACE (*)   ? Ketones, ur >80 (*)   ? Protein, ur 100 (*)   ? All other components within normal limits  ?RESP PANEL BY RT-PCR (FLU A&B, COVID) ARPGX2  ?LIPASE, BLOOD  ? ? ?EKG ?None ? ?Radiology ?DG Chest 2 View ? ?Result Date: 04/22/2022 ?CLINICAL DATA:  Cough EXAM: CHEST - 2 VIEW COMPARISON:  07/07/2021 FINDINGS: The heart size and mediastinal contours are within normal limits. Right lower lobe airspace consolidation. Probable trace right pleural effusion. No pneumothorax. The visualized skeletal structures are unremarkable. IMPRESSION: Right lower lobe pneumonia. Electronically Signed   By: Duanne GuessNicholas  Plundo D.O.   On: 04/22/2022 12:25  ? ?CT Renal Stone Study ? ?Result Date: 04/22/2022 ?CLINICAL DATA:  Flank pain, kidney stone suspected; right flank pain EXAM: CT ABDOMEN AND PELVIS WITHOUT CONTRAST TECHNIQUE: Multidetector CT imaging of the abdomen and pelvis was performed following the standard protocol without IV contrast. RADIATION DOSE REDUCTION: This exam was performed according to the departmental dose-optimization program which includes automated exposure control, adjustment of the mA and/or kV according to patient size and/or use of iterative reconstruction technique. COMPARISON:  None Available. FINDINGS: Lower chest: Right greater than left lower lobe consolidation and ground-glass opacities. Hepatobiliary: Too small to characterize low-density hepatic lesions may reflect cysts. Gallbladder is unremarkable. Pancreas: Unremarkable. Spleen: Unremarkable. Adrenals/Urinary Tract: Adrenals, kidneys, and bladder are unremarkable. Stomach/Bowel: Stomach is within normal limits. Bowel is normal in caliber. Contrast is within the colon. Vascular/Lymphatic: Mild calcified plaque along the right common iliac. No enlarged nodes. Reproductive: Unremarkable. Other: No free fluid.  Abdominal wall is unremarkable. Musculoskeletal: No acute osseous abnormality. IMPRESSION: Right greater than left lower lobe pneumonia. No  acute abnormality in the abdomen or pelvis. Electronically Signed   By: Guadlupe SpanishPraneil  Patel M.D.   On: 04/22/2022 12:49   ? ?Procedures ?Procedures  ? ? ?Medications Ordered in ED ?Medications  ?sodium chloride 0.9 % bolus 1,000 mL (0 mLs Intravenous Stopped 04/22/22 1350)  ?ondansetron [facility](ZOFRAN) injection 4 mg (4 mg Intravenous Given 04/22/22 1241)  ?doxycycline (VIBRA-TABS) tablet 100 mg (100 mg Oral Given 04/22/22 1352)  ? ? ?ED Course/ Medical Decision Making/ A&P ?  ?                        ?Medical Decision Making ?Amount and/or Complexity of Data Reviewed ?Labs: ordered. ?Radiology: ordered. ? ?Risk ?Prescription drug management. ? ? ?Bradley Arias is here with body aches  and viral symptoms.  Normal vitals.  No fever.  Right flank pain.  Infectious work-up initiated with CBC, CMP, lipase, CT scan abdomen pelvis, chest x-ray, COVID and flu testing.  Viral sounding symptoms for the last several days.  No significant medical history.  Differential diagnosis is likely viral process versus pneumonia versus UTI versusKidney stone.  Will give IV fluids, IV Zofran. ? ?Chest x-ray per my review and interpretation appears to have pneumonia.  Urinalysis negative for infection.  Per further review of labs there is no significant anemia, electrolyte abnormality, leukocytosis.  He has no fever.  Have no concern for sepsis.  He is not immunocompromise.  COVID and flu test are negative.  CT scan did not show any evidence of kidney stone.  Did show likely pneumonia.  We will treat with antibiotics.  Given history and physical and risk factors I think he is safe for outpatient treatment with close follow-up.  Discharged in good condition. ? ?This chart was dictated using voice recognition software.  Despite best efforts to proofread,  errors can occur which can change the documentation meaning.  ? ? ? ? ? ? ? ?Final Clinical Impression(s) / ED Diagnoses ?Final diagnoses:  ?Community acquired pneumonia, unspecified laterality   ? ? ?Rx / DC Orders ?ED Discharge Orders   ? ?      Ordered  ?  doxycycline (VIBRAMYCIN) 100 MG capsule  2 times daily       ? 04/22/22 1354  ?  ondansetron (ZOFRAN) 4 MG tablet  Every 6 hours       ? 04/22/22

## 2022-04-22 NOTE — ED Triage Notes (Signed)
Patient endorses cold sweats, diarrhea, and generalized body pain. Patient reports he started having symptoms x2 days ago.  ?

## 2022-04-22 NOTE — ED Triage Notes (Signed)
Patient BIB [facility] from Home. ? ?Endorses Flu-Like Symptoms including N/V/D. For approximately 2-3 Days now. ? ?VSS with [facility].  ?

## 2022-04-22 NOTE — Discharge Instructions (Signed)
Take antibiotic for lung infection.  Take your next dose antibiotic tomorrow morning.  I have also prescribed you Zofran for nausea.  Please return if you are getting worse.  ?

## 2022-04-22 NOTE — ED Notes (Signed)
Discharge instructions, follow up care, and prescriptions reviewed and explained, pt verbalized understanding.  

## 2022-04-24 ENCOUNTER — Emergency Department (HOSPITAL_COMMUNITY)
Admission: EM | Admit: 2022-04-24 | Discharge: 2022-04-24 | Disposition: A | Discharge status: Home / Self Care | Payer: Self-pay | Attending: Emergency Medicine | Admitting: Emergency Medicine

## 2022-04-24 ENCOUNTER — Other Ambulatory Visit: Payer: Self-pay

## 2022-04-24 ENCOUNTER — Encounter (HOSPITAL_COMMUNITY): Payer: Self-pay | Admitting: Emergency Medicine

## 2022-04-24 ENCOUNTER — Emergency Department (HOSPITAL_COMMUNITY): Payer: Self-pay

## 2022-04-24 DIAGNOSIS — R6883 Chills (without fever): Secondary | ICD-10-CM | POA: Insufficient documentation

## 2022-04-24 DIAGNOSIS — R1110 Vomiting, unspecified: Secondary | ICD-10-CM | POA: Insufficient documentation

## 2022-04-24 DIAGNOSIS — R079 Chest pain, unspecified: Secondary | ICD-10-CM | POA: Insufficient documentation

## 2022-04-24 DIAGNOSIS — J189 Pneumonia, unspecified organism: Secondary | ICD-10-CM

## 2022-04-24 LAB — BASIC METABOLIC PANEL
Anion gap: 9 (ref 5–15)
BUN: 9 mg/dL (ref 6–20)
CO2: 24 mmol/L (ref 22–32)
Calcium: 8.4 mg/dL — ABNORMAL LOW (ref 8.9–10.3)
Chloride: 105 mmol/L (ref 98–111)
Creatinine, Ser: 0.96 mg/dL (ref 0.61–1.24)
GFR, Estimated: >60 mL/min (ref >60)
Glucose, Bld: 92 mg/dL (ref 70–99)
Potassium: 3.6 mmol/L (ref 3.5–5.1)
Sodium: 138 mmol/L (ref 135–145)

## 2022-04-24 LAB — CBC WITH DIFFERENTIAL/PLATELET
Abs Immature Granulocytes: 0.12 10*3/uL — ABNORMAL HIGH (ref 0.00–0.07)
Basophils Absolute: 0 10*3/uL (ref 0.0–0.1)
Basophils Relative: 1 %
Eosinophils Absolute: 0 10*3/uL (ref 0.0–0.5)
Eosinophils Relative: 0 %
HCT: 37.6 % — ABNORMAL LOW (ref 39.0–52.0)
Hemoglobin: 12.9 g/dL — ABNORMAL LOW (ref 13.0–17.0)
Immature Granulocytes: 2 %
Lymphocytes Relative: 31 %
Lymphs Abs: 1.6 10*3/uL (ref 0.7–4.0)
MCH: 30.8 pg (ref 26.0–34.0)
MCHC: 34.3 g/dL (ref 30.0–36.0)
MCV: 89.7 fL (ref 80.0–100.0)
Monocytes Absolute: 0.6 10*3/uL (ref 0.1–1.0)
Monocytes Relative: 11 %
Neutro Abs: 2.9 10*3/uL (ref 1.7–7.7)
Neutrophils Relative %: 55 %
Platelets: 319 10*3/uL (ref 150–400)
RBC: 4.19 MIL/uL — ABNORMAL LOW (ref 4.22–5.81)
RDW: 14.3 % (ref 11.5–15.5)
WBC: 5.2 10*3/uL (ref 4.0–10.5)
nRBC: 0 % (ref 0.0–0.2)

## 2022-04-24 LAB — TROPONIN I (HIGH SENSITIVITY): Troponin I (High Sensitivity): 7 ng/L (ref <18)

## 2022-04-24 MED ORDER — ACETAMINOPHEN 500 MG PO TABS
1000.0000 mg | ORAL_TABLET | Freq: Once | ORAL | Status: AC
  Administered 2022-04-24: 1000 mg via ORAL
  Filled 2022-04-24: qty 2

## 2022-04-24 MED ORDER — DOXYCYCLINE HYCLATE 100 MG PO TABS
100.0000 mg | ORAL_TABLET | Freq: Once | ORAL | Status: AC
  Administered 2022-04-24: 100 mg via ORAL
  Filled 2022-04-24: qty 1

## 2022-04-24 NOTE — ED Notes (Signed)
Pt off unit to CT

## 2022-04-24 NOTE — ED Provider Notes (Signed)
?[facility] EMERGENCY DEPARTMENT ?Provider Note ? ? ?CSN: [id number] ?Arrival date & time: 04/24/22  1147 ? ?  ? ?History ? ?Chief Complaint  ?Patient presents with  ? Chest Pain  ? Emesis  ? ? ?Taichi Repka is a 31 y.o. male. ? ?HPI ? ?31 year old male presents emergency department with concern for chest pain.  Patient was diagnosed with pneumonia 2 days ago.  Placed on doxycycline.  Patient states that he is taken 1 dose of doxycycline in the past 2 days, seemed confused that this was a twice a day medication.  Coming in with intermittent sharp left-sided chest pain.  This is not positional, not pleuritic.  He has associated cough with phlegm production.  Endorses chills but no documented fever.  No swelling of his lower extremities.  Pain does not radiate into his back.  He has been taking his Zofran which has been helping with his nausea/posttussive vomiting. ? ?Home Medications ?Prior to Admission medications   ?Medication Sig Start Date End Date Taking? Authorizing Provider  ?dicyclomine (BENTYL) 20 MG tablet Take 1 tablet (20 mg total) by mouth 2 (two) times daily. 09/18/20   Gerhard Munch, MD  ?doxycycline (VIBRAMYCIN) 100 MG capsule Take 1 capsule (100 mg total) by mouth 2 (two) times daily. 04/22/22   Curatolo, Adam, DO  ?HYDROcodone-acetaminophen (NORCO) 5-325 MG per tablet Take 1 tablet by mouth every 6 (six) hours as needed for pain. ?Patient not taking: Reported on 09/18/2020 10/07/13   Jaynie Crumble, PA-C  ?ondansetron (ZOFRAN ODT) 4 MG disintegrating tablet Take 1 tablet (4 mg total) by mouth every 8 (eight) hours as needed for nausea or vomiting. 07/07/21   Margarita Grizzle, MD  ?ondansetron (ZOFRAN ODT) 8 MG disintegrating tablet Take 1 tablet (8 mg total) by mouth every 8 (eight) hours as needed for nausea or vomiting. 07/07/21   Margarita Grizzle, MD  ?ondansetron (ZOFRAN) 4 MG tablet Take 1 tablet (4 mg total) by mouth every 6 (six) hours. 04/22/22   Curatolo, Adam, DO   ?pantoprazole (PROTONIX) 20 MG tablet Take 1 tablet (20 mg total) by mouth daily. 07/07/21   Margarita Grizzle, MD  ?   ? ?Allergies    ?Ibuprofen   ? ?Review of Systems   ?Review of Systems  ?Constitutional:  Positive for chills and fatigue. Negative for fever.  ?Respiratory:  Positive for cough and shortness of breath. Negative for chest tightness.   ?Cardiovascular:  Positive for chest pain. Negative for palpitations and leg swelling.  ?Gastrointestinal:  Negative for abdominal pain, diarrhea and vomiting.  ?Musculoskeletal:  Negative for back pain.  ?Skin:  Negative for rash.  ?Neurological:  Negative for headaches.  ? ?Physical Exam ?Updated Vital Signs ?BP 114/71   Pulse 65   Temp 97.7 ?F (36.5 ?C) (Oral)   Resp (!) 23   Ht 6\' 1"  (1.854 m)   Wt 79.4 kg   SpO2 97%   BMI 23.09 kg/m?  ?Physical Exam ?Vitals and nursing note reviewed.  ?Constitutional:   ?   General: He is not in acute distress. ?   Appearance: Normal appearance. He is not diaphoretic.  ?HENT:  ?   Head: Normocephalic.  ?   Mouth/Throat:  ?   Mouth: Mucous membranes are moist.  ?Cardiovascular:  ?   Rate and Rhythm: Normal rate.  ?Pulmonary:  ?   Effort: Pulmonary effort is normal. No respiratory distress.  ?   Breath sounds: Examination of the right-lower field reveals decreased breath sounds. Examination  of the left-lower field reveals decreased breath sounds. Decreased breath sounds present. No wheezing.  ?Abdominal:  ?   Palpations: Abdomen is soft.  ?   Tenderness: There is no abdominal tenderness.  ?Musculoskeletal:  ?   Right lower leg: No edema.  ?   Left lower leg: No edema.  ?Skin: ?   General: Skin is warm.  ?Neurological:  ?   Mental Status: He is alert and oriented to person, place, and time. Mental status is at baseline.  ?Psychiatric:     ?   Mood and Affect: Mood normal.  ? ? ?ED Results / Procedures / Treatments   ?Labs ?(all labs ordered are listed, but only abnormal results are displayed) ?Labs Reviewed  ?CBC WITH  DIFFERENTIAL/PLATELET  ?BASIC METABOLIC PANEL  ?TROPONIN I (HIGH SENSITIVITY)  ? ? ?EKG ?EKG Interpretation ? ?Date/Time:  Wednesday Apr 24 2022 12:02:24 EDT ?Ventricular Rate:  58 ?PR Interval:  156 ?QRS Duration: 88 ?QT Interval:  426 ?QTC Calculation: 418 ?R Axis:   90 ?Text Interpretation: Sinus bradycardia Rightward axis Borderline ECG When compared with ECG of 07-Jul-2021 09:11, PREVIOUS ECG IS PRESENT Confirmed by Coralee Pesa [phone]) on 04/24/2022 4:30:10 PM ? ?Radiology ?No results found. ? ?Procedures ?Procedures  ? ? ?Medications Ordered in ED ?Medications  ?acetaminophen (TYLENOL) tablet 1,000 mg (has no administration in time range)  ?doxycycline (VIBRA-TABS) tablet 100 mg (has no administration in time range)  ? ? ?ED Course/ Medical Decision Making/ A&P ?  ?                        ?Medical Decision Making ?Amount and/or Complexity of Data Reviewed ?Labs: ordered. ?Radiology: ordered. ? ?Risk ?OTC drugs. ?Prescription drug management. ? ? ?31 year old male with recent diagnosis of pneumonia presents emergency department with ongoing chest pain.  Was prescribed doxycycline, has only taken 1 dose in the past 2 days.  Was confused about the directions in regards to this. ? ?No hypoxia here, he is tachypneic at times.  Chest x-ray reiterates pneumonia, no pneumothorax or other abnormal findings.  Blood work is reassuring, cardiac work-up is negative.  Low suspicion for pericarditis, ACS, PE.  Gave the patient a doxycycline dose here in the department and educated him on the proper usage of this antibiotic.  He now understands.  Patient at this time appears safe and stable for discharge and close outpatient follow up. Discharge plan and strict return to ED precautions discussed, patient verbalizes understanding and agreement. ? ? ? ? ? ? ? ?Final Clinical Impression(s) / ED Diagnoses ?Final diagnoses:  ?None  ? ? ?Rx / DC Orders ?ED Discharge Orders   ? ? None  ? ?  ? ? ?  ?Rozelle Logan, DO ?04/24/22  2047 ? ?

## 2022-04-24 NOTE — ED Triage Notes (Signed)
Per [facility] pt was diagnosed with pneumonia at [facility] on 15th and has only taken one dose of his antibiotics and states he does not feel any better.  ?

## 2022-04-24 NOTE — ED Provider Triage Note (Signed)
Emergency Medicine Provider Triage Evaluation Note  Tarance Balan , a 31 y.o. male  was evaluated in triage.  Pt was diagnosed with pneumonia at droppage on 04/22/2022.  He was discharged home with Zofran and doxycycline.  He states that he took the 1 dose of doxycycline that was given to him prior to discharge and an additional tablet since then.  He feels that his symptoms are worsening and he continues to be nauseous with posttussive vomiting.  He reports "throwing up blood".   Review of Systems  Positive: As above Negative:   Physical Exam  BP 135/71 (BP Location: Right Arm)   Pulse 67   Temp 97.7 F (36.5 C) (Oral)   Resp 16   Ht 6\' 1"  (1.854 m)   Wt 79.4 kg   SpO2 96%   BMI 23.09 kg/m  Gen:   Awake, no distress, ill-appearing Resp:  Normal effort, productive cough, possible rales in right lower lobe MSK:   Moves extremities without difficulty  Other:    Medical Decision Making  Medically screening exam initiated at 12:10 PM.  Appropriate orders placed.  Aeneas Longsworth was informed that the remainder of the evaluation will be completed by another provider, this initial triage assessment does not replace that evaluation, and the importance of remaining in the ED until their evaluation is complete.     Haydee Salter, Janell Quiet 04/24/22 1214

## 2022-04-24 NOTE — Discharge Instructions (Signed)
Take your prescribed antibiotic, doxycycline twice a day as directed.  Stay well-hydrated.  Take nausea medicine as needed.  Take Tylenol for pain control. ?

## 2022-04-27 ENCOUNTER — Emergency Department (HOSPITAL_COMMUNITY): Payer: Self-pay

## 2022-04-27 ENCOUNTER — Other Ambulatory Visit: Payer: Self-pay

## 2022-04-27 ENCOUNTER — Emergency Department (HOSPITAL_COMMUNITY)
Admission: EM | Admit: 2022-04-27 | Discharge: 2022-04-27 | Disposition: A | Discharge status: Home / Self Care | Payer: Self-pay | Attending: Emergency Medicine | Admitting: Emergency Medicine

## 2022-04-27 ENCOUNTER — Encounter (HOSPITAL_COMMUNITY): Payer: Self-pay | Admitting: Emergency Medicine

## 2022-04-27 DIAGNOSIS — F419 Anxiety disorder, unspecified: Secondary | ICD-10-CM | POA: Insufficient documentation

## 2022-04-27 DIAGNOSIS — R791 Abnormal coagulation profile: Secondary | ICD-10-CM | POA: Insufficient documentation

## 2022-04-27 DIAGNOSIS — R770 Abnormality of albumin: Secondary | ICD-10-CM | POA: Insufficient documentation

## 2022-04-27 DIAGNOSIS — D75839 Thrombocytosis, unspecified: Secondary | ICD-10-CM | POA: Insufficient documentation

## 2022-04-27 DIAGNOSIS — J188 Other pneumonia, unspecified organism: Secondary | ICD-10-CM | POA: Insufficient documentation

## 2022-04-27 DIAGNOSIS — E876 Hypokalemia: Secondary | ICD-10-CM | POA: Insufficient documentation

## 2022-04-27 DIAGNOSIS — R7989 Other specified abnormal findings of blood chemistry: Secondary | ICD-10-CM

## 2022-04-27 DIAGNOSIS — J189 Pneumonia, unspecified organism: Secondary | ICD-10-CM

## 2022-04-27 DIAGNOSIS — R1010 Upper abdominal pain, unspecified: Secondary | ICD-10-CM | POA: Insufficient documentation

## 2022-04-27 LAB — CBC
HCT: 40.2 % (ref 39.0–52.0)
Hemoglobin: 14 g/dL (ref 13.0–17.0)
MCH: 30.9 pg (ref 26.0–34.0)
MCHC: 34.8 g/dL (ref 30.0–36.0)
MCV: 88.7 fL (ref 80.0–100.0)
Platelets: 518 10*3/uL — ABNORMAL HIGH (ref 150–400)
RBC: 4.53 MIL/uL (ref 4.22–5.81)
RDW: 13.6 % (ref 11.5–15.5)
WBC: 6.8 10*3/uL (ref 4.0–10.5)
nRBC: 0 % (ref 0.0–0.2)

## 2022-04-27 LAB — BASIC METABOLIC PANEL
Anion gap: 9 (ref 5–15)
BUN: 10 mg/dL (ref 6–20)
CO2: 29 mmol/L (ref 22–32)
Calcium: 9.2 mg/dL (ref 8.9–10.3)
Chloride: 98 mmol/L (ref 98–111)
Creatinine, Ser: 1.09 mg/dL (ref 0.61–1.24)
GFR, Estimated: >60 mL/min (ref >60)
Glucose, Bld: 105 mg/dL — ABNORMAL HIGH (ref 70–99)
Potassium: 3.1 mmol/L — ABNORMAL LOW (ref 3.5–5.1)
Sodium: 136 mmol/L (ref 135–145)

## 2022-04-27 LAB — D-DIMER, QUANTITATIVE: D-Dimer, Quant: 0.81 ug/mL-FEU — ABNORMAL HIGH (ref 0.00–0.50)

## 2022-04-27 LAB — HEPATIC FUNCTION PANEL
ALT: 13 U/L (ref 0–44)
AST: 17 U/L (ref 15–41)
Albumin: 3.1 g/dL — ABNORMAL LOW (ref 3.5–5.0)
Alkaline Phosphatase: 53 U/L (ref 38–126)
Bilirubin, Direct: 0.2 mg/dL (ref 0.0–0.2)
Indirect Bilirubin: 0.9 mg/dL (ref 0.3–0.9)
Total Bilirubin: 1.1 mg/dL (ref 0.3–1.2)
Total Protein: 6.9 g/dL (ref 6.5–8.1)

## 2022-04-27 LAB — LIPASE, BLOOD: Lipase: 30 U/L (ref 11–51)

## 2022-04-27 LAB — RAPID HIV SCREEN (HIV 1/2 AB+AG)
HIV 1/2 Antibodies: NONREACTIVE
HIV-1 P24 Antigen - HIV24: NONREACTIVE

## 2022-04-27 LAB — TROPONIN I (HIGH SENSITIVITY): Troponin I (High Sensitivity): 4 ng/L (ref <18)

## 2022-04-27 MED ORDER — METOCLOPRAMIDE HCL 10 MG PO TABS: 10.0000 mg | ORAL_TABLET | Freq: Four times a day (QID) | ORAL | 0 refills | Status: AC

## 2022-04-27 MED ORDER — SODIUM CHLORIDE 0.9 % IV BOLUS
1000.0000 mL | Freq: Once | INTRAVENOUS | Status: AC
  Administered 2022-04-27: 1000 mL via INTRAVENOUS

## 2022-04-27 MED ORDER — DOXYCYCLINE HYCLATE 100 MG PO CAPS: 100.0000 mg | ORAL_CAPSULE | Freq: Two times a day (BID) | ORAL | 0 refills | Status: AC

## 2022-04-27 MED ORDER — ALBUTEROL SULFATE HFA 108 (90 BASE) MCG/ACT IN AERS
2.0000 | INHALATION_SPRAY | RESPIRATORY_TRACT | Status: DC | PRN
  Administered 2022-04-27: 2 via RESPIRATORY_TRACT
  Filled 2022-04-27: qty 6.7

## 2022-04-27 MED ORDER — AMOXICILLIN-POT CLAVULANATE 875-125 MG PO TABS: 1.0000 | ORAL_TABLET | Freq: Two times a day (BID) | ORAL | 0 refills | Status: AC

## 2022-04-27 MED ORDER — ONDANSETRON HCL 4 MG/2ML IJ SOLN
4.0000 mg | Freq: Once | INTRAMUSCULAR | Status: AC
Start: 2022-04-27 — End: 2022-04-27
  Administered 2022-04-27: 4 mg via INTRAVENOUS
  Filled 2022-04-27: qty 2

## 2022-04-27 MED ORDER — POTASSIUM CHLORIDE CRYS ER 20 MEQ PO TBCR
40.0000 meq | EXTENDED_RELEASE_TABLET | Freq: Once | ORAL | Status: AC
  Administered 2022-04-27: 40 meq via ORAL
  Filled 2022-04-27: qty 2

## 2022-04-27 MED ORDER — KETOROLAC TROMETHAMINE 15 MG/ML IJ SOLN: 15.0000 mg | Freq: Once | INTRAMUSCULAR | Status: DC

## 2022-04-27 MED ORDER — KETOROLAC TROMETHAMINE 15 MG/ML IJ SOLN
15.0000 mg | Freq: Once | INTRAMUSCULAR | Status: AC
  Administered 2022-04-27: 15 mg via INTRAVENOUS
  Filled 2022-04-27: qty 1

## 2022-04-27 MED ORDER — ACETAMINOPHEN 500 MG PO TABS: 1000.0000 mg | ORAL_TABLET | Freq: Four times a day (QID) | ORAL | 0 refills | Status: AC | PRN

## 2022-04-27 MED ORDER — ACETAMINOPHEN 325 MG PO TABS
650.0000 mg | ORAL_TABLET | Freq: Once | ORAL | Status: AC
Start: 2022-04-27 — End: 2022-04-27
  Administered 2022-04-27: 650 mg via ORAL
  Filled 2022-04-27: qty 2

## 2022-04-27 MED ORDER — IOHEXOL 350 MG/ML SOLN
75.0000 mL | Freq: Once | INTRAVENOUS | Status: AC | PRN
  Administered 2022-04-27: 75 mL via INTRAVENOUS

## 2022-04-27 NOTE — Discharge Instructions (Addendum)
Please pick up medications and take as prescribed. I have extended your doxycycline however added an additional antibiotic as well.   Take the pain medication and nausea medication as needed.   Follow up with [facility] for primary care needs. It is recommended that you have a repeat xray in 3-4 weeks to make sure the pneumonia has cleared.   Return to the ED for any new/worsening symptoms

## 2022-04-27 NOTE — ED Provider Notes (Incomplete)
CTA.  Physical Exam  BP (!) 138/93   Pulse 60   Temp 98 F (36.7 C)   Resp 13   Ht 6\' 1"  (1.854 m)   Wt 79.4 kg   SpO2 100%   BMI 23.09 kg/m   Physical Exam  Procedures  Procedures  ED Course / MDM    Medical Decision Making Amount and/or Complexity of Data Reviewed Labs: ordered. Radiology: ordered.  Risk OTC drugs. Prescription drug management.   ***

## 2022-04-27 NOTE — ED Provider Notes (Signed)
Care assumed from [facility], [place], at shift change, please see their notes for full documentation of patient's complaint/HPI. Briefly, pt here with ongoing chest pain s/2 recent pneumonia diagnosis. Results so far show slight decreased RLL pneumonia and elevated d dimer 0.81. Awaiting CTA at this time to rule out PE or other concerning findings. Plan is to dispo accordingly.   Physical Exam  BP 124/69   Pulse (!) 56   Temp 98 F (36.7 C)   Resp 18   Ht 6\' 1"  (1.854 m)   Wt 79.4 kg   SpO2 98%   BMI 23.09 kg/m   Physical Exam Vitals and nursing note reviewed.  Constitutional:      Appearance: He is not ill-appearing.  HENT:     Head: Normocephalic and atraumatic.  Eyes:     Conjunctiva/sclera: Conjunctivae normal.  Cardiovascular:     Rate and Rhythm: Normal rate and regular rhythm.  Pulmonary:     Effort: Pulmonary effort is normal.     Breath sounds: Normal breath sounds.  Skin:    General: Skin is warm and dry.     Coloration: Skin is not jaundiced.  Neurological:     Mental Status: He is alert.    Procedures  Procedures  ED Course / MDM    Medical Decision Making CTA: IMPRESSION:  No evidence of pulmonary embolism.   Right greater than left lower lobe airspace disease, highly  suspicious for pneumonia  Pt had been ordered fluids and zofran prior to sign out. After CT scan he was provided food prior to zofran. He vomited afterwards. Zofran provided at this time. Will reevaluate after fluids and zofran.   Pt reports mild improvement with zofran however is hesitant to eat or drink as he states that anytime he does he vomits. He would like to go home at this time and rest. He reports he has 5 tablets left of doxycycline and only a couple tablets left of zofran. Will extend course of doxy at this time, add augmentin, and provide reglan. Pt discharged with Ibuprofen as well for pain. He is provided info for [facility] for primary care needs and advised he  will need a repeat CXR in 3-4 weeks to ensure resolution. Pt in agreement with plan and stable for discharge home.   Amount and/or Complexity of Data Reviewed Labs: ordered. Radiology: ordered.  Risk OTC drugs. Prescription drug management.          [facility], PA-C 04/27/22 2030    04/29/22, MD 04/27/22 2112

## 2022-04-27 NOTE — ED Notes (Signed)
Pt given chicken broth for PO challenge

## 2022-04-27 NOTE — ED Provider Notes (Signed)
[facility] EMERGENCY DEPARTMENT Provider Note   CSN: [id number] Arrival date & time: 04/27/22  1221     History  Chief Complaint  Patient presents with   Chest Pain    Bradley Arias is a 31 y.o. male.  Patient with history of asthma, recent diagnosis of pneumonia presents to the emergency department today for evaluation of chest pain and abdominal pain.  Patient initially seen in the emergency department on 04/22/2022 for body aches, cough.  At that visit, patient was started on doxycycline for pneumonia which was demonstrated on chest x-ray as well as the CT.  Pneumonia was in bilateral lower lungs.  Vital signs were reassuring.  He tested negative for COVID at that time.  Patient then returned a couple of days later and was reevaluated.  He had not been taking his doxycycline appropriately.  Chest x-ray continued to show pneumonia and patient was discharged to home.  Today he continues to have a squeezing pain in the middle of his chest.  He has frequent coughing with pain worse with palpation of the mid chest and pain worse with coughing and deep breathing.  Sometimes he feels short of breath coughing.  He also has some pain in the upper abdomen.  No recent vomiting.  No lightheadedness or syncope.  He was concerned because he is not feeling better.  He has had subjective fevers. Patient denies risk factors for pulmonary embolism including: unilateral leg swelling, history of DVT/PE/other blood clots, use of exogenous hormones, recent immobilizations, recent surgery, recent travel (>4hr segment), malignancy, hemoptysis.         Home Medications Prior to Admission medications   Medication Sig Start Date End Date Taking? Authorizing Provider  dicyclomine (BENTYL) 20 MG tablet Take 1 tablet (20 mg total) by mouth 2 (two) times daily. 09/18/20   Carmin Muskrat, MD  doxycycline (VIBRAMYCIN) 100 MG capsule Take 1 capsule (100 mg total) by mouth 2 (two) times daily.  04/22/22   Curatolo, Adam, DO  HYDROcodone-acetaminophen (NORCO) 5-325 MG per tablet Take 1 tablet by mouth every 6 (six) hours as needed for pain. Patient not taking: Reported on 09/18/2020 10/07/13   Jeannett Senior, PA-C  ondansetron (ZOFRAN ODT) 4 MG disintegrating tablet Take 1 tablet (4 mg total) by mouth every 8 (eight) hours as needed for nausea or vomiting. 07/07/21   Pattricia Boss, MD  ondansetron (ZOFRAN ODT) 8 MG disintegrating tablet Take 1 tablet (8 mg total) by mouth every 8 (eight) hours as needed for nausea or vomiting. 07/07/21   Pattricia Boss, MD  ondansetron (ZOFRAN) 4 MG tablet Take 1 tablet (4 mg total) by mouth every 6 (six) hours. 04/22/22   Curatolo, Adam, DO  pantoprazole (PROTONIX) 20 MG tablet Take 1 tablet (20 mg total) by mouth daily. 07/07/21   Pattricia Boss, MD      Allergies    Ibuprofen    Review of Systems   Review of Systems  Physical Exam Updated Vital Signs BP (!) 138/93   Pulse 60   Temp 98 F (36.7 C)   Resp 13   Ht 6\' 1"  (1.854 m)   Wt 79.4 kg   SpO2 100%   BMI 23.09 kg/m   Physical Exam Vitals and nursing note reviewed.  Constitutional:      General: He is not in acute distress.    Appearance: He is well-developed.  HENT:     Head: Normocephalic and atraumatic.  Eyes:     General:  Right eye: No discharge.        Left eye: No discharge.     Conjunctiva/sclera: Conjunctivae normal.  Cardiovascular:     Rate and Rhythm: Normal rate and regular rhythm.     Heart sounds: Normal heart sounds.  Pulmonary:     Effort: Pulmonary effort is normal.     Breath sounds: Rhonchi present.  Abdominal:     Palpations: Abdomen is soft.     Tenderness: There is no abdominal tenderness.  Musculoskeletal:     Cervical back: Normal range of motion and neck supple.  Skin:    General: Skin is warm and dry.  Neurological:     Mental Status: He is alert.  Psychiatric:        Mood and Affect: Mood is anxious.    ED Results / Procedures /  Treatments   Labs (all labs ordered are listed, but only abnormal results are displayed) Labs Reviewed  BASIC METABOLIC PANEL - Abnormal; Notable for the following components:      Result Value   Potassium 3.1 (*)    Glucose, Bld 105 (*)    All other components within normal limits  CBC - Abnormal; Notable for the following components:   Platelets 518 (*)    All other components within normal limits  HEPATIC FUNCTION PANEL - Abnormal; Notable for the following components:   Albumin 3.1 (*)    All other components within normal limits  D-DIMER, QUANTITATIVE - Abnormal; Notable for the following components:   D-Dimer, Quant 0.81 (*)    All other components within normal limits  LIPASE, BLOOD  RAPID HIV SCREEN (HIV 1/2 AB+AG)  TROPONIN I (HIGH SENSITIVITY)    EKG EKG Interpretation  Date/Time:  Saturday Apr 27 2022 12:36:15 EDT Ventricular Rate:  57 PR Interval:  172 QRS Duration: 96 QT Interval:  446 QTC Calculation: 434 R Axis:   87 Text Interpretation: Sinus bradycardia No significant change since last tracing When compared with ECG of 24-Apr-2022 12:02, PREVIOUS ECG IS PRESENT Confirmed by Gwyneth Sprout ([id number]) on 04/27/2022 1:52:00 PM  Radiology DG Chest 2 View  Result Date: 04/27/2022 CLINICAL DATA:  Chest pain EXAM: CHEST - 2 VIEW COMPARISON:  04/24/2022 FINDINGS: Persistent but decreased patchy opacity at the right lung base within the lower lobe. No new findings. Normal heart size. No pleural effusion or pneumothorax. No acute osseous abnormality. IMPRESSION: Persistent but decreased right lower lobe probable pneumonia. Electronically Signed   By: Guadlupe Spanish M.D.   On: 04/27/2022 13:55    Procedures Procedures    Medications Ordered in ED Medications  potassium chloride SA (KLOR-CON M) CR tablet 40 mEq (has no administration in time range)  acetaminophen (TYLENOL) tablet 650 mg (650 mg Oral Given 04/27/22 1437)  ketorolac (TORADOL) 15 MG/ML injection 15 mg  (15 mg Intravenous Given 04/27/22 1540)    ED Course/ Medical Decision Making/ A&P    Patient seen and examined. History obtained directly from patient.   Labs/EKG: Ordered CBC, BMP, troponin due to complaint of chest pain ordered in triage.  Added hepatic function panel, lipase, HIV.  Discussed D-dimer and implications with patient.  He would like to be screened for blood clot given persistent symptoms.  Imaging: Ordered chest x-ray.  Medications/Fluids: Ordered: Tylenol.   Most recent vital signs reviewed and are as follows: BP (!) 138/93   Pulse 60   Temp 98 F (36.7 C)   Resp 13   Ht 6\' 1"  (1.854 m)  Wt 79.4 kg   SpO2 100%   BMI 23.09 kg/m   Initial impression: Pneumonia, possibly chest wall pain or pleurisy from his infection.  This is his third emergency to visit for this in the past week so will broaden work-up.  If D-dimer is elevated, will need imaging of the chest.  3:45 PM Reassessment performed. Patient appears comfortable, unchanged exam.  Labs personally reviewed and interpreted including: CBC with normal white blood cell count, hemoglobin, mild elevation in platelets at 518; BMP demonstrating mild hypokalemia at 3.1; liver function testing with mildly low albumin at 3.1 otherwise unremarkable; D-dimer elevated at 0.81; normal lipase; negative HIV testing; a troponin of 4.  Imaging personally visualized and interpreted including: chest x-ray agree persistent pneumonia.  Reviewed pertinent lab work and imaging with patient at bedside. Questions answered.   Most current vital signs reviewed and are as follows: BP 129/70   Pulse 61   Temp 98 F (36.7 C)   Resp 18   Ht 6\' 1"  (1.854 m)   Wt 79.4 kg   SpO2 100%   BMI 23.09 kg/m   Plan: CT imaging of the chest to rule out PE and further evaluate pneumonia.  Signout to [employer] at shift change.  Patient will be given a dose of potassium for mild hypokalemia and an albuterol inhaler for use at home.                            Medical Decision Making Amount and/or Complexity of Data Reviewed Labs: ordered. Radiology: ordered.  Risk OTC drugs. Prescription drug management.  Patient presented with chest pain in setting of recent pneumonia.  He does have an element of chest wall pain and possible pleuritis.  Very low concern for ACS at this point with normal EKG and troponin which is normal.  D-dimer elevated awaiting CT of the chest to rule out PE.  He otherwise looks well, nontoxic but anxious.  If imaging is reassuring, feel that he can be discharged home with continued antibiotic therapy.  Will provide inhaler.         Final Clinical Impression(s) / ED Diagnoses Final diagnoses:  Multifocal pneumonia  Elevated d-dimer    Rx / DC Orders ED Discharge Orders     None         Carlisle Cater, PA-C 04/27/22 1549    Blanchie Dessert, MD 04/28/22 [phone]

## 2022-04-27 NOTE — ED Triage Notes (Signed)
Pt returns for L sided chest pain with sob, nausea, and vomiting x 4 days.  States he has been seen at [facility] and here for same but continues to have pain.  Pt was diagnosed with pneumonia.

## 2022-12-03 IMAGING — CR DG CHEST 2V
2 series · 2 of 2 positions shown · non-contrast
Comparison: 04/24/2022

CLINICAL DATA: Chest pain

EXAM:
CHEST - 2 VIEW

[chest pa]
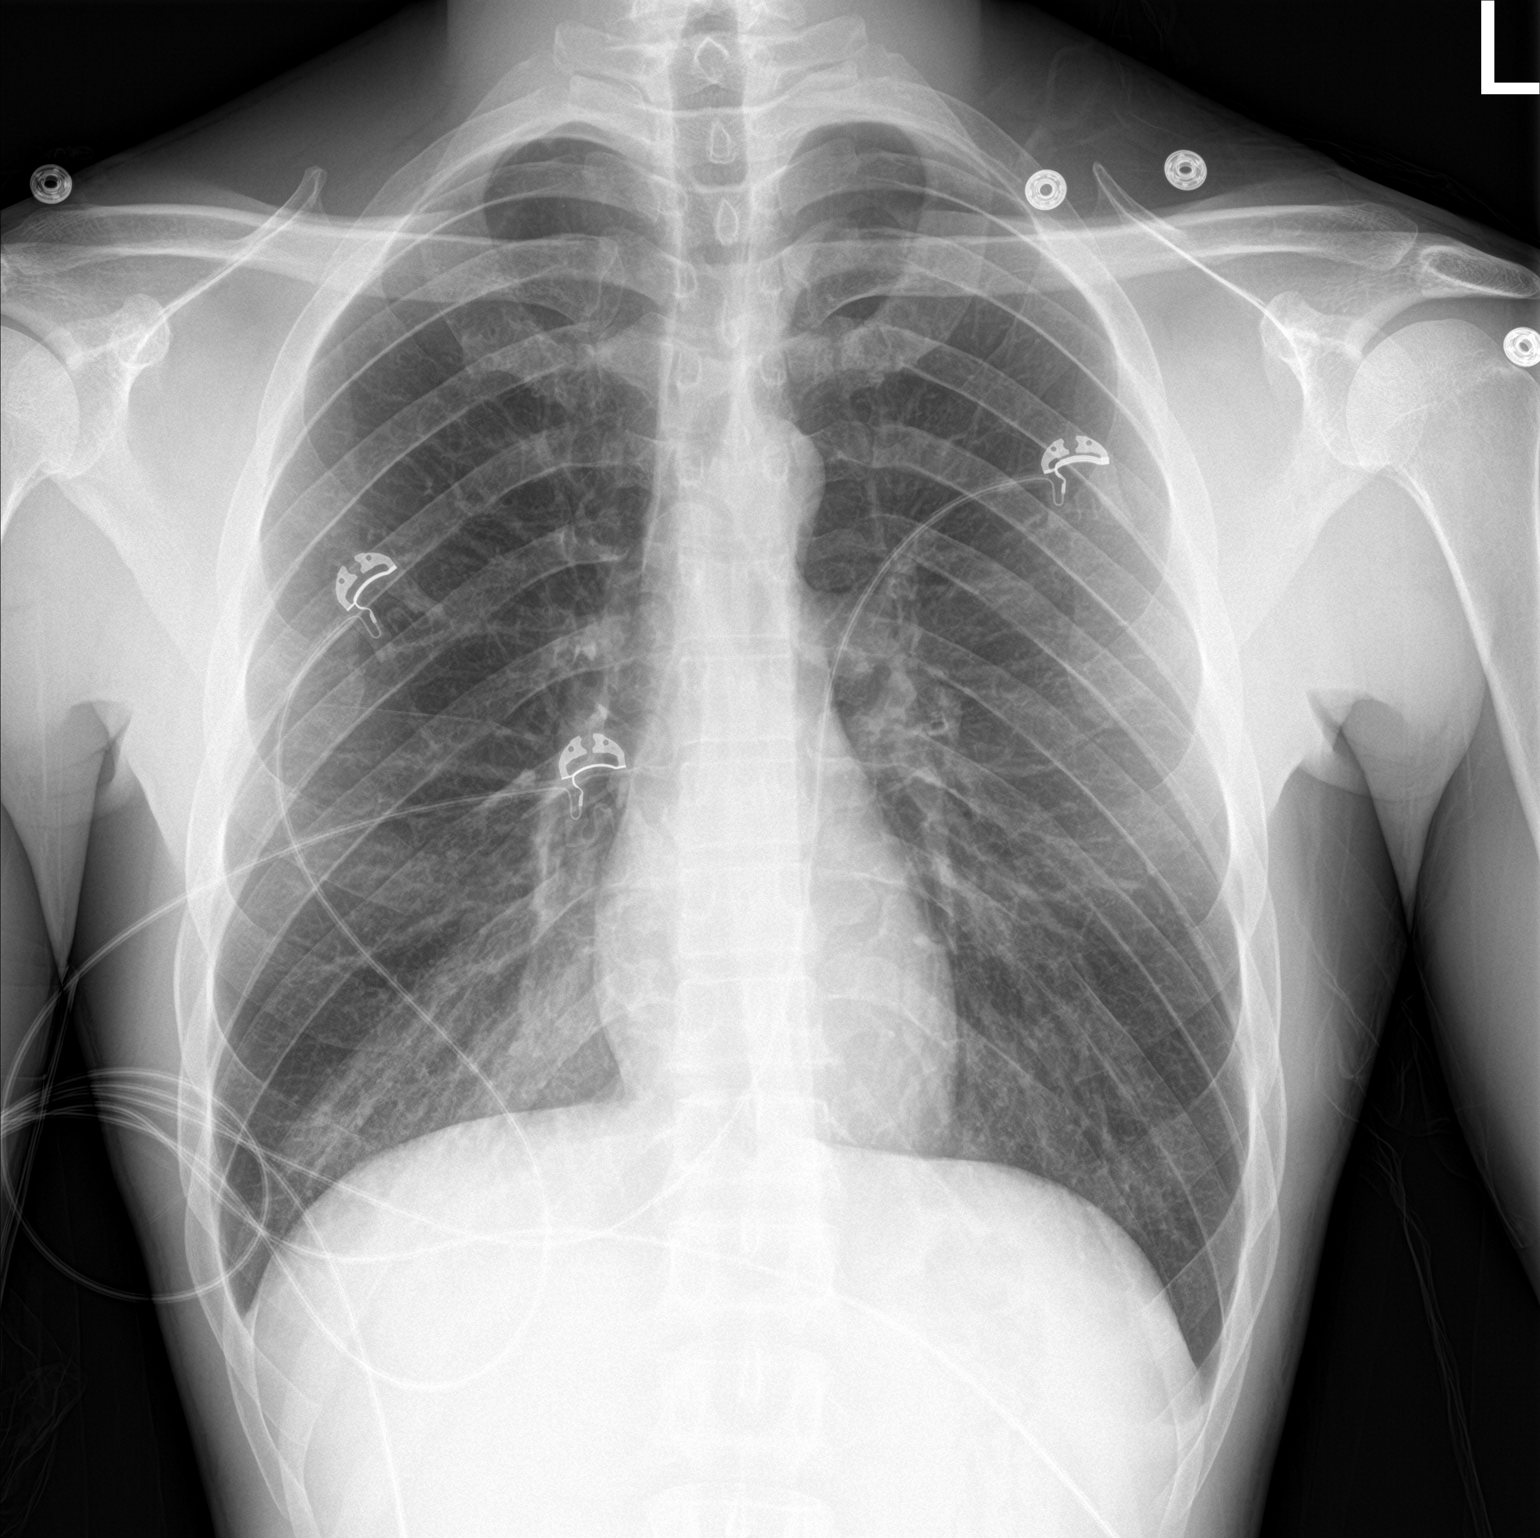

[chest lat]
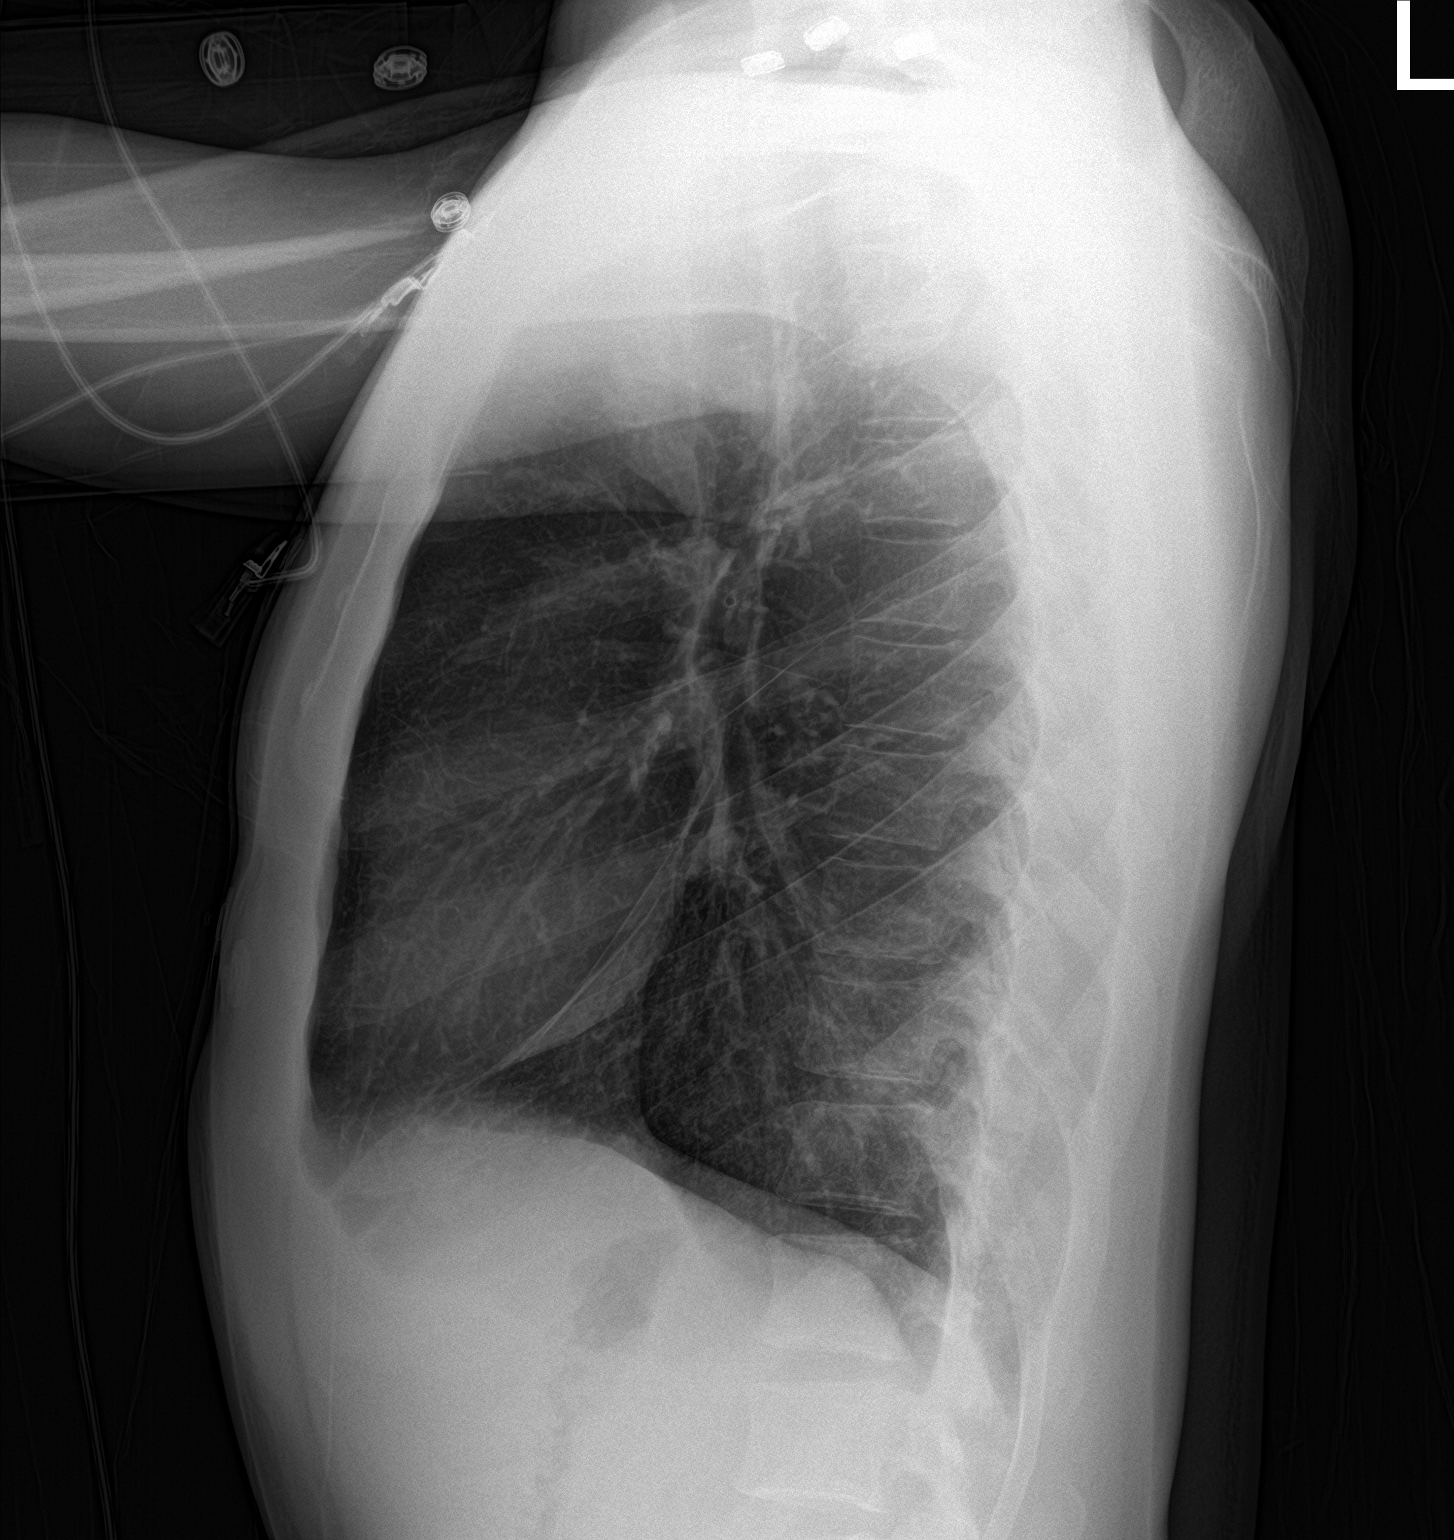

[2 of 2 positions shown; findings below may reference images not displayed]

FINDINGS: Persistent but decreased patchy opacity at the right lung base
within the lower lobe. No new findings. Normal heart size. No
pleural effusion or pneumothorax. No acute osseous abnormality.
IMPRESSION: Persistent but decreased right lower lobe probable pneumonia.

## 2022-12-03 IMAGING — CT CT ANGIO CHEST
2 of 7 series · 19 of 46 positions shown · IV contrast (agent unspecified)
Comparison: None Available.

CLINICAL DATA: Left-sided chest pain and shortness of breath.
Nausea and vomiting. Clinical suspicion for pulmonary embolism.

EXAM:
CT ANGIOGRAPHY CHEST WITH CONTRAST
TECHNIQUE: Multidetector CT imaging of the chest was performed using the
standard protocol during bolus administration of intravenous
contrast. Multiplanar CT image reconstructions and MIPs were
obtained to evaluate the vascular anatomy.

[Series 6: thins · axial · 0.78mm/px · z∈[+1109,+1412]mm · 16 of 341 slices shown]
[im 19/341  lung]
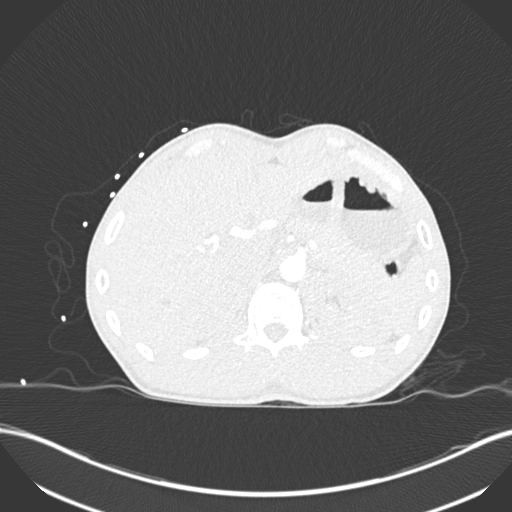
[im 38/341  soft-tissue]
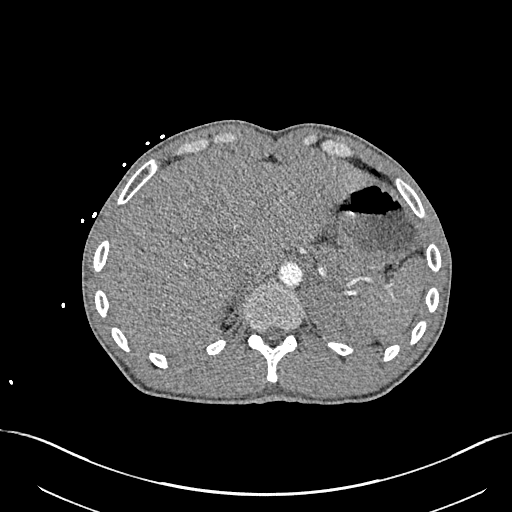
[im 57/341  lung]
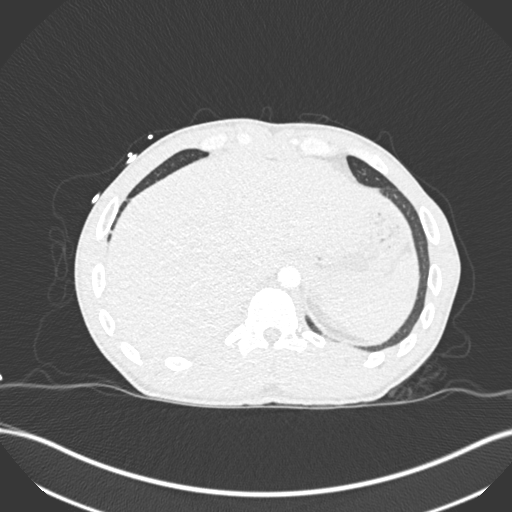
[im 76/341  soft-tissue]
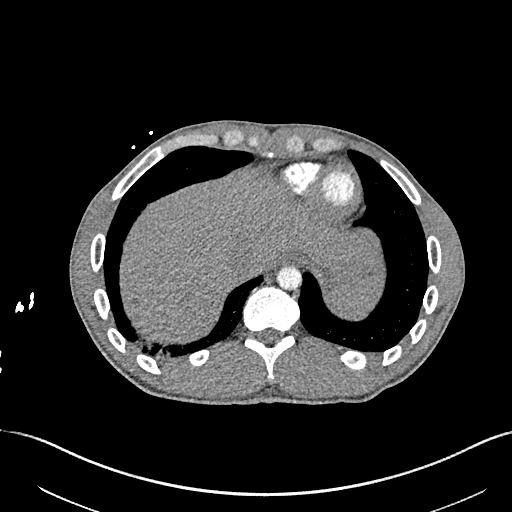
[im 95/341  lung]
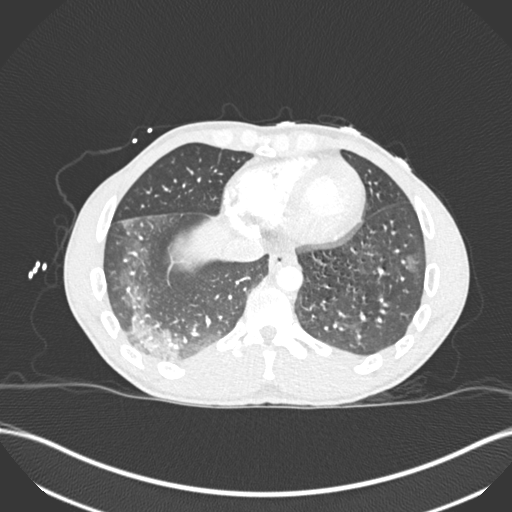
[im 114/341  soft-tissue]
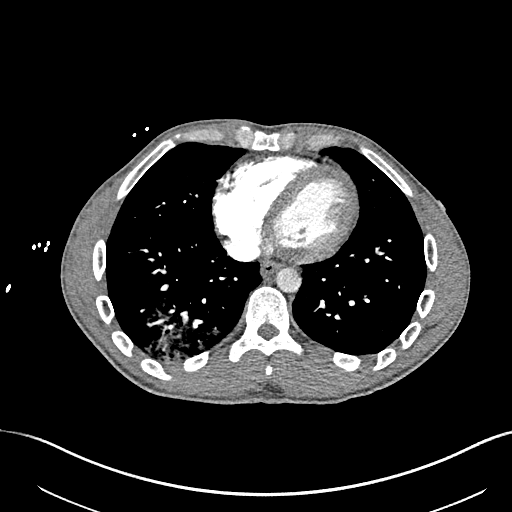
[im 133/341  lung]
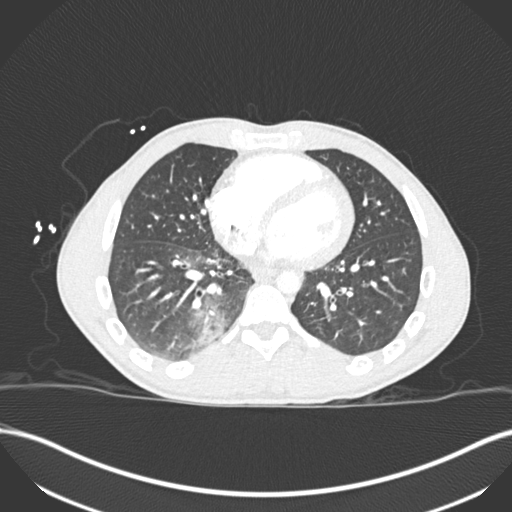
[im 152/341  soft-tissue]
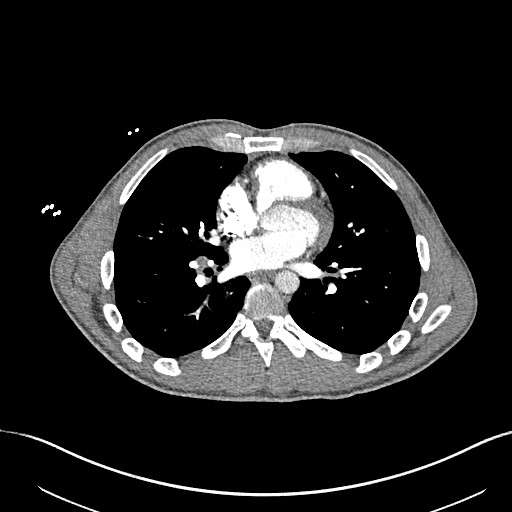
[im 189/341  lung]
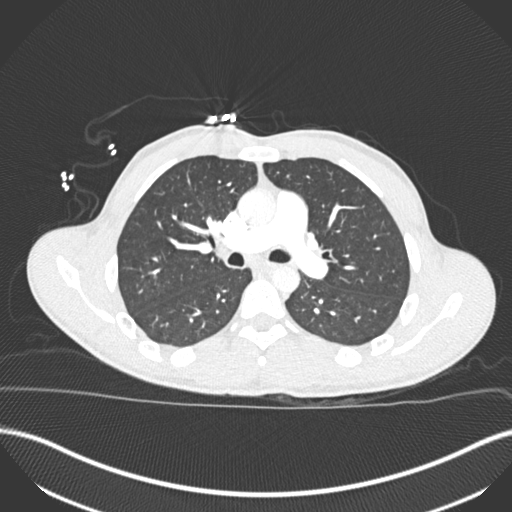
[im 208/341  soft-tissue]
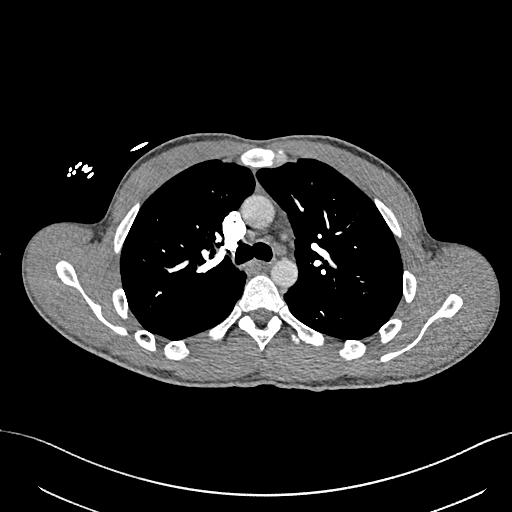
[im 227/341  lung]
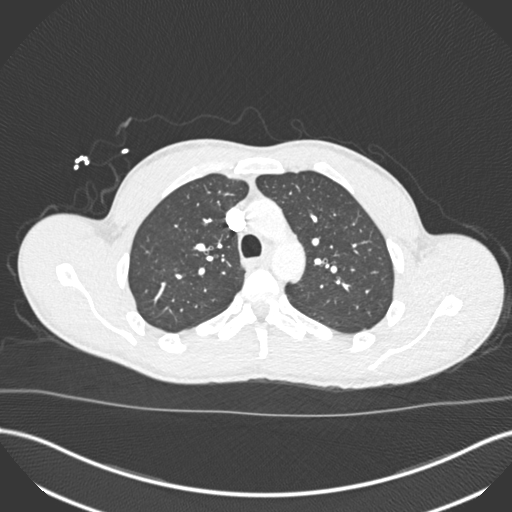
[im 246/341  soft-tissue]
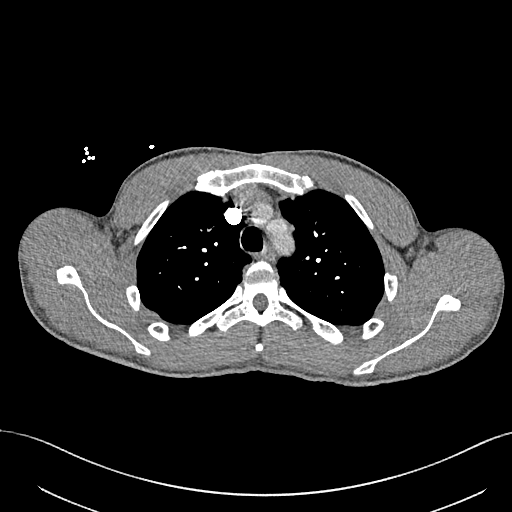
[im 265/341  lung]
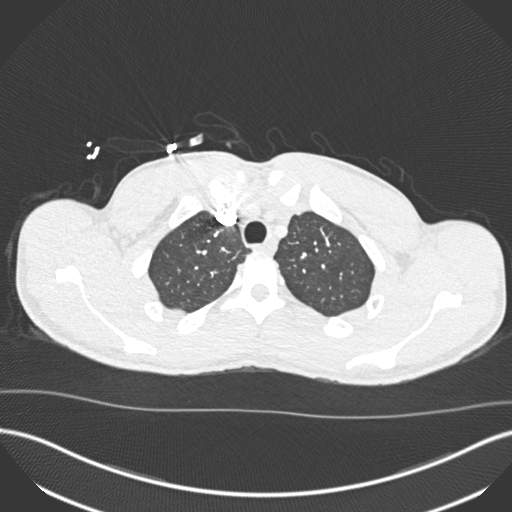
[im 284/341  soft-tissue]
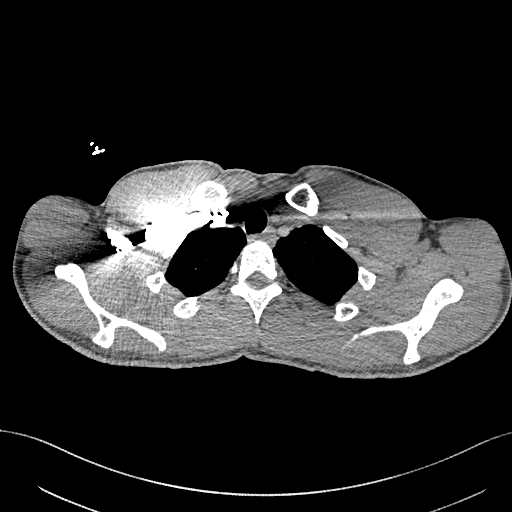
[im 303/341  lung]
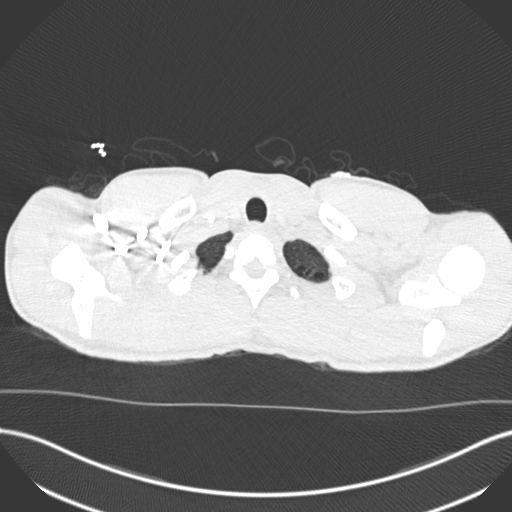
[im 322/341  soft-tissue]
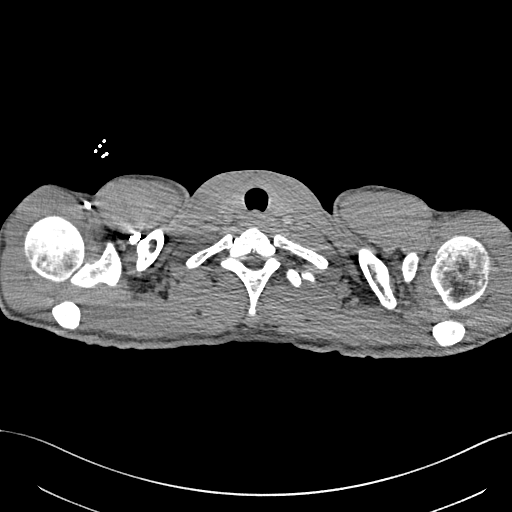

[Series 8: coronal mpr · coronal · 0.67mm/px · 3 of 109 slices shown]
[im 28/109  soft-tissue]
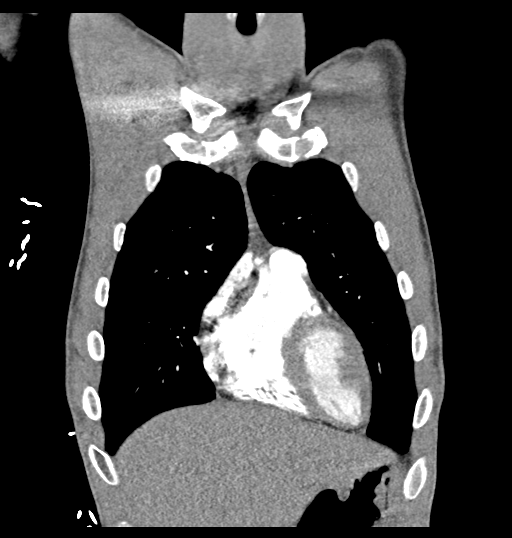
[im 55/109  soft-tissue]
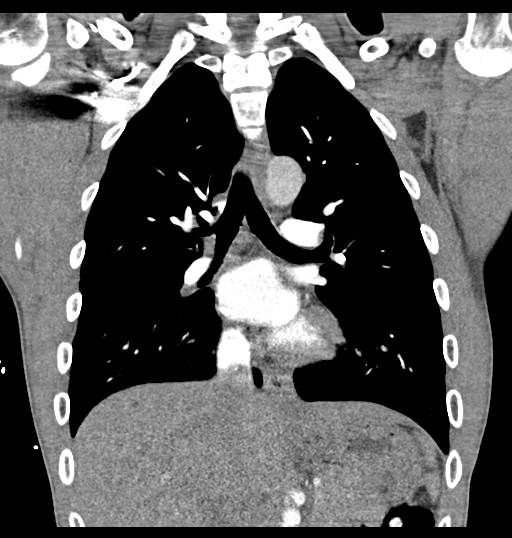
[im 82/109  soft-tissue]
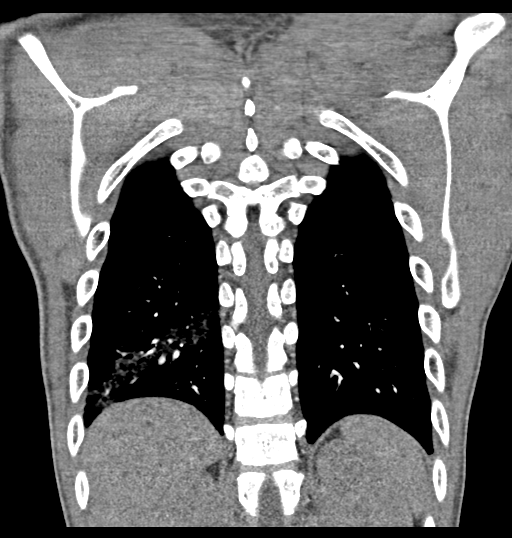

[19 of 46 positions shown; findings below may reference images not displayed]

RADIATION DOSE REDUCTION: This exam was performed according to the
departmental dose-optimization program which includes automated
exposure control, adjustment of the mA and/or kV according to
patient size and/or use of iterative reconstruction technique.

CONTRAST:  75mL OMNIPAQUE IOHEXOL 350 MG/ML SOLN
FINDINGS: Cardiovascular: Satisfactory opacification of pulmonary arteries
noted, and no pulmonary emboli identified. No evidence of thoracic
aortic dissection or aneurysm.

Mediastinum/Nodes: No masses or pathologically enlarged lymph nodes
identified.

Lungs/Pleura: Right lower lobe airspace disease is seen, suspicious
for pneumonia. Minimal patchy airspace disease is noted in the
lateral left lower lobe. No evidence of central endobronchial
obstruction. No evidence of pleural effusion.

Upper abdomen: No acute findings. Multiple tiny hepatic cysts are
noted.

Musculoskeletal: No suspicious bone lesions identified.

Review of the MIP images confirms the above findings.
IMPRESSION: No evidence of pulmonary embolism.

Right greater than left lower lobe airspace disease, highly
suspicious for pneumonia.

## 2023-01-30 ENCOUNTER — Encounter (HOSPITAL_BASED_OUTPATIENT_CLINIC_OR_DEPARTMENT_OTHER): Payer: Self-pay | Admitting: Emergency Medicine

## 2023-01-30 ENCOUNTER — Other Ambulatory Visit (HOSPITAL_BASED_OUTPATIENT_CLINIC_OR_DEPARTMENT_OTHER): Payer: Self-pay

## 2023-01-30 ENCOUNTER — Other Ambulatory Visit: Payer: Self-pay

## 2023-01-30 ENCOUNTER — Emergency Department (HOSPITAL_BASED_OUTPATIENT_CLINIC_OR_DEPARTMENT_OTHER)
Admission: EM | Admit: 2023-01-30 | Discharge: 2023-01-30 | Disposition: A | Discharge status: Home / Self Care | Payer: Self-pay | Attending: Emergency Medicine | Admitting: Emergency Medicine

## 2023-01-30 DIAGNOSIS — R112 Nausea with vomiting, unspecified: Secondary | ICD-10-CM | POA: Insufficient documentation

## 2023-01-30 DIAGNOSIS — Z1152 Encounter for screening for COVID-19: Secondary | ICD-10-CM | POA: Insufficient documentation

## 2023-01-30 DIAGNOSIS — I10 Essential (primary) hypertension: Secondary | ICD-10-CM | POA: Insufficient documentation

## 2023-01-30 DIAGNOSIS — R1084 Generalized abdominal pain: Secondary | ICD-10-CM | POA: Insufficient documentation

## 2023-01-30 DIAGNOSIS — R197 Diarrhea, unspecified: Secondary | ICD-10-CM | POA: Insufficient documentation

## 2023-01-30 LAB — CBC WITH DIFFERENTIAL/PLATELET
Abs Immature Granulocytes: 0.01 10*3/uL (ref 0.00–0.07)
Basophils Absolute: 0.1 10*3/uL (ref 0.0–0.1)
Basophils Relative: 1 %
Eosinophils Absolute: 0.2 10*3/uL (ref 0.0–0.5)
Eosinophils Relative: 3 %
HCT: 43.1 % (ref 39.0–52.0)
Hemoglobin: 14.9 g/dL (ref 13.0–17.0)
Immature Granulocytes: 0 %
Lymphocytes Relative: 32 %
Lymphs Abs: 2.4 10*3/uL (ref 0.7–4.0)
MCH: 31.2 pg (ref 26.0–34.0)
MCHC: 34.6 g/dL (ref 30.0–36.0)
MCV: 90.2 fL (ref 80.0–100.0)
Monocytes Absolute: 0.9 10*3/uL (ref 0.1–1.0)
Monocytes Relative: 12 %
Neutro Abs: 4 10*3/uL (ref 1.7–7.7)
Neutrophils Relative %: 52 %
Platelets: 279 10*3/uL (ref 150–400)
RBC: 4.78 MIL/uL (ref 4.22–5.81)
RDW: 14.2 % (ref 11.5–15.5)
WBC: 7.7 10*3/uL (ref 4.0–10.5)
nRBC: 0 % (ref 0.0–0.2)

## 2023-01-30 LAB — BASIC METABOLIC PANEL
Anion gap: 12 (ref 5–15)
BUN: 18 mg/dL (ref 6–20)
CO2: 24 mmol/L (ref 22–32)
Calcium: 9.7 mg/dL (ref 8.9–10.3)
Chloride: 103 mmol/L (ref 98–111)
Creatinine, Ser: 1.15 mg/dL (ref 0.61–1.24)
GFR, Estimated: >60 mL/min (ref >60)
Glucose, Bld: 77 mg/dL (ref 70–99)
Potassium: 4.1 mmol/L (ref 3.5–5.1)
Sodium: 139 mmol/L (ref 135–145)

## 2023-01-30 LAB — RESP PANEL BY RT-PCR (RSV, FLU A&B, COVID)  RVPGX2
Influenza A by PCR: NEGATIVE
Influenza B by PCR: NEGATIVE
Resp Syncytial Virus by PCR: NEGATIVE
SARS Coronavirus 2 by RT PCR: NEGATIVE

## 2023-01-30 LAB — LIPASE, BLOOD: Lipase: 33 U/L (ref 11–51)

## 2023-01-30 MED ORDER — ONDANSETRON HCL 4 MG PO TABS
4.0000 mg | ORAL_TABLET | Freq: Four times a day (QID) | ORAL | 0 refills | Status: DC
  Filled 2023-01-30: qty 12, 3d supply, fill #0

## 2023-01-30 MED ORDER — ONDANSETRON HCL 4 MG/2ML IJ SOLN
4.0000 mg | Freq: Once | INTRAMUSCULAR | Status: AC
  Administered 2023-01-30: 4 mg via INTRAVENOUS
  Filled 2023-01-30: qty 2

## 2023-01-30 MED ORDER — SODIUM CHLORIDE 0.9 % IV BOLUS
1000.0000 mL | Freq: Once | INTRAVENOUS | Status: AC
  Administered 2023-01-30: 1000 mL via INTRAVENOUS

## 2023-01-30 NOTE — ED Provider Notes (Signed)
[facility] Provider Note   CSN: [id number] Arrival date & time: 01/30/23  1309     History  Chief Complaint  Patient presents with   Emesis    Bradley Arias is a 32 y.o. male.  Patient presents to the emergency department via EMS complaining of generalized abdominal pain, nausea, vomiting, and diarrhea which began Monday night.  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  This employee prepared food for the patient.  The patient is concerned he may have food poisoning and also concerned that instead of that he may have COVID or influenza.  He states he had multiple episodes of emesis and diarrhea since onset.  He denies blood in the stool, chest pain, shortness of breath he endorses generalized abdominal discomfort, nausea, vomiting, and diarrhea.  Past medical history significant for hypertension, anxiety, ADHD  HPI     Home Medications Prior to Admission medications   Medication Sig Start Date End Date Taking? Authorizing Provider  ondansetron (ZOFRAN) 4 MG tablet Take 1 tablet (4 mg total) by mouth every 6 (six) hours. 01/30/23  Yes Dorothyann Peng, PA-C  acetaminophen (TYLENOL) 500 MG tablet Take 2 tablets (1,000 mg total) by mouth every 6 (six) hours as needed for moderate pain. 04/27/22   Alroy Bailiff, Margaux, PA-C  amoxicillin-clavulanate (AUGMENTIN) 875-125 MG tablet Take 1 tablet by mouth every 12 (twelve) hours. 04/27/22   Alroy Bailiff, Margaux, PA-C  dicyclomine (BENTYL) 20 MG tablet Take 1 tablet (20 mg total) by mouth 2 (two) times daily. 09/18/20   Carmin Muskrat, MD  HYDROcodone-acetaminophen (NORCO) 5-325 MG per tablet Take 1 tablet by mouth every 6 (six) hours as needed for pain. Patient not taking: Reported on 09/18/2020 10/07/13   Jeannett Senior, PA-C  metoCLOPramide (REGLAN) 10 MG tablet  Take 1 tablet (10 mg total) by mouth every 6 (six) hours. 04/27/22   Alroy Bailiff, Margaux, PA-C  pantoprazole (PROTONIX) 20 MG tablet Take 1 tablet (20 mg total) by mouth daily. 07/07/21   Pattricia Boss, MD      Allergies    Ibuprofen    Review of Systems   Review of Systems  HENT:  Positive for rhinorrhea.   Gastrointestinal:  Positive for abdominal pain, diarrhea, nausea and vomiting.    Physical Exam Updated Vital Signs BP (!) 141/84 (BP Location: Right Arm)   Pulse 81   Temp (!) 97.5 F (36.4 C) (Oral)   Resp 16   SpO2 100%  Physical Exam Vitals and nursing note reviewed.  Constitutional:      General: He is not in acute distress.    Appearance: He is well-developed.  HENT:     Head: Normocephalic and atraumatic.  Eyes:     Conjunctiva/sclera: Conjunctivae normal.  Cardiovascular:     Rate and Rhythm: Normal rate and regular rhythm.     Heart sounds: No murmur heard. Pulmonary:     Effort: Pulmonary effort is normal. No respiratory distress.     Breath sounds: Normal breath sounds.  Abdominal:     Palpations: Abdomen is soft.     Tenderness: There is no abdominal tenderness.  Musculoskeletal:        General: No swelling.     Cervical back: Neck supple.  Skin:    General: Skin is warm and dry.     Capillary Refill: Capillary refill  takes less than 2 seconds.  Neurological:     Mental Status: He is alert.  Psychiatric:        Mood and Affect: Mood normal.     ED Results / Procedures / Treatments   Labs (all labs ordered are listed, but only abnormal results are displayed) Labs Reviewed  RESP PANEL BY RT-PCR (RSV, FLU A&B, COVID)  [id number]  BASIC METABOLIC PANEL  CBC WITH DIFFERENTIAL/PLATELET  LIPASE, BLOOD    EKG None  Radiology No results found.  Procedures Procedures    Medications Ordered in ED Medications  ondansetron (ZOFRAN) injection 4 mg (4 mg Intravenous Given 01/30/23 1337)  sodium chloride 0.9 % bolus 1,000 mL (1,000 mLs Intravenous New  Bag/Given 01/30/23 1337)    ED Course/ Medical Decision Making/ A&P                             Medical Decision Making Amount and/or Complexity of Data Reviewed Labs: ordered.  Risk Prescription drug management.   This patient presents to the ED for concern of abdominal pain, nausea, vomiting, this involves an extensive number of treatment options, and is a complaint that carries with it a high risk of complications and morbidity.  The differential diagnosis includes gastroenteritis, appendicitis, cholecystitis, COVID, influenza, others   Co morbidities that complicate the patient evaluation  Anxiety, hypertension, tobacco use   Additional history obtained:  Additional history obtained from EMS   Lab Tests:  I Ordered, and personally interpreted labs.  The pertinent results include: Negative respiratory panel, unremarkable BMP, CBC, lipase   Imaging Studies ordered:  The patient has no tenderness to palpation in the abdominal region.  There is no indication at this time for abdominal imaging    Problem List / ED Course / Critical interventions / Medication management   I ordered medication including Zofran for nausea, normal saline for fluid resuscitation Reevaluation of the patient after these medicines showed that the patient improved I have reviewed the patients home medicines and have made adjustments as needed   Social Determinants of Health:  Patient is uninsured and has transportation difficulty   Test / Admission - Considered:  Patient's workup was grossly unremarkable.  Respiratory panel was negative.  Lab work was grossly unremarkable.  Very low clinical suspicion appendicitis, cholecystitis, colitis, pancreatitis.  Symptoms are most consistent with gastroenteritis.  Plan to discharge patient home with prescription for Zofran and recommendations for supportive care at home.  Return precautions provided        Final Clinical Impression(s) / ED  Diagnoses Final diagnoses:  Nausea vomiting and diarrhea    Rx / DC Orders ED Discharge Orders          Ordered    ondansetron (ZOFRAN) 4 MG tablet  Every 6 hours        01/30/23 1438              Ronny Bacon 01/30/23 1438    Leanord Asal K, DO 01/30/23 1510

## 2023-01-30 NOTE — ED Triage Notes (Signed)
Pt reports n/v/d and body aches x 3 days. Pt reports he was go into his bathroom, vomit, and "pass out" in the floor.

## 2023-01-30 NOTE — Discharge Instructions (Addendum)
You were evaluated today for nausea, vomiting, diarrhea.  The symptoms are most consistent with gastroenteritis.  It is likely that this is a stomach "bug".  I have prescribed Zofran for nausea.  Please take as directed.  Please be sure to continue to drink fluids as you are able.  This will hopefully resolve on its own over the next few days.  If you develop any life-threatening symptoms please return to the emergency department.

## 2023-01-30 NOTE — ED Notes (Signed)
Pt given applesauce, apple juice and crackers for PO challenge, per Fritz Pickerel, PA-C

## 2023-02-06 ENCOUNTER — Other Ambulatory Visit (HOSPITAL_BASED_OUTPATIENT_CLINIC_OR_DEPARTMENT_OTHER): Payer: Self-pay

## 2024-03-31 ENCOUNTER — Emergency Department (HOSPITAL_COMMUNITY): Payer: Self-pay

## 2024-03-31 ENCOUNTER — Other Ambulatory Visit: Payer: Self-pay

## 2024-03-31 ENCOUNTER — Encounter (HOSPITAL_COMMUNITY): Payer: Self-pay

## 2024-03-31 ENCOUNTER — Encounter (HOSPITAL_COMMUNITY): Payer: Self-pay | Admitting: Emergency Medicine

## 2024-03-31 ENCOUNTER — Emergency Department (HOSPITAL_COMMUNITY)
Admission: EM | Admit: 2024-03-31 | Discharge: 2024-04-01 | Discharge status: Against Medical Advice | Payer: Self-pay | Attending: Emergency Medicine | Admitting: Emergency Medicine

## 2024-03-31 ENCOUNTER — Emergency Department (HOSPITAL_COMMUNITY)
Admission: EM | Admit: 2024-03-31 | Discharge: 2024-03-31 | Discharge status: Against Medical Advice | Payer: Self-pay | Attending: Emergency Medicine | Admitting: Emergency Medicine

## 2024-03-31 DIAGNOSIS — R112 Nausea with vomiting, unspecified: Secondary | ICD-10-CM | POA: Insufficient documentation

## 2024-03-31 DIAGNOSIS — Z5321 Procedure and treatment not carried out due to patient leaving prior to being seen by health care provider: Secondary | ICD-10-CM | POA: Insufficient documentation

## 2024-03-31 DIAGNOSIS — R079 Chest pain, unspecified: Secondary | ICD-10-CM | POA: Insufficient documentation

## 2024-03-31 LAB — CBC
HCT: 49 % (ref 39.0–52.0)
Hemoglobin: 16.5 g/dL (ref 13.0–17.0)
MCH: 30.7 pg (ref 26.0–34.0)
MCHC: 33.7 g/dL (ref 30.0–36.0)
MCV: 91.1 fL (ref 80.0–100.0)
Platelets: 340 10*3/uL (ref 150–400)
RBC: 5.38 MIL/uL (ref 4.22–5.81)
RDW: 13.7 % (ref 11.5–15.5)
WBC: 7 10*3/uL (ref 4.0–10.5)
nRBC: 0 % (ref 0.0–0.2)

## 2024-03-31 LAB — COMPREHENSIVE METABOLIC PANEL WITH GFR
ALT: 27 U/L (ref 0–44)
AST: 28 U/L (ref 15–41)
Albumin: 4.8 g/dL (ref 3.5–5.0)
Alkaline Phosphatase: 76 U/L (ref 38–126)
Anion gap: 18 — ABNORMAL HIGH (ref 5–15)
BUN: 25 mg/dL — ABNORMAL HIGH (ref 6–20)
CO2: 20 mmol/L — ABNORMAL LOW (ref 22–32)
Calcium: 9.8 mg/dL (ref 8.9–10.3)
Chloride: 96 mmol/L — ABNORMAL LOW (ref 98–111)
Creatinine, Ser: 1.25 mg/dL — ABNORMAL HIGH (ref 0.61–1.24)
GFR, Estimated: >60 mL/min (ref >60)
Glucose, Bld: 99 mg/dL (ref 70–99)
Potassium: 4.9 mmol/L (ref 3.5–5.1)
Sodium: 134 mmol/L — ABNORMAL LOW (ref 135–145)
Total Bilirubin: 1.3 mg/dL — ABNORMAL HIGH (ref 0.0–1.2)
Total Protein: 8.6 g/dL — ABNORMAL HIGH (ref 6.5–8.1)

## 2024-03-31 LAB — LIPASE, BLOOD: Lipase: 24 U/L (ref 11–51)

## 2024-03-31 LAB — TROPONIN I (HIGH SENSITIVITY): Troponin I (High Sensitivity): <2 ng/L (ref <18)

## 2024-03-31 MED ORDER — ONDANSETRON HCL 4 MG/2ML IJ SOLN
4.0000 mg | Freq: Once | INTRAMUSCULAR | Status: AC
  Administered 2024-03-31: 4 mg via INTRAVENOUS
  Filled 2024-03-31: qty 2

## 2024-03-31 MED ORDER — ONDANSETRON 4 MG PO TBDP: 4.0000 mg | ORAL_TABLET | Freq: Once | ORAL | Status: DC | PRN

## 2024-03-31 NOTE — ED Provider Triage Note (Signed)
 Emergency Medicine Provider Triage Evaluation Note  Bradley Arias , a 33 y.o. male  was evaluated in triage.  Pt complains of chest pain, nausea, vomiting. Describes chest pain as anterior without radiation. Patient says these symptoms have been ongoing since yesterday. Patient was evaluated this morning for same issues before eloping, labs/imaging were obtained then.   Review of Systems  Positive: As above Negative: As above  Physical Exam  BP (!) 137/91   Pulse 89   Temp (!) 97.5 F (36.4 C) (Oral)   Resp 19   Ht 6\' 1"  (1.854 m)   Wt 78.9 kg   SpO2 98%   BMI 22.96 kg/m  Gen:   Awake, no distress   Resp:  Normal effort  MSK:   Moves extremities without difficulty  Other:    Medical Decision Making  Medically screening exam initiated at 4:28 PM.  Appropriate orders placed.  Bradley Arias was informed that the remainder of the evaluation will be completed by another provider, this initial triage assessment does not replace that evaluation, and the importance of remaining in the ED until their evaluation is complete.     [name], [place] 03/31/24 1630

## 2024-03-31 NOTE — ED Provider Notes (Signed)
 EMS triage  Patient with a complaint of left anterior chest pain since last night constant in nature.  Associated with some nausea and vomiting.  EMS said something about abdominal pain patient denies any abdominal pain.  Chest nontender Lungs are clear Abdomen soft  Will order CBC troponins chest x-ray and we will go ahead and do complete metabolic panel since there was originally a complaint of abdominal pain.  EKG sinus bradycardia with heart rate of 55 nothing acute.     Javoni Lucken, MD 03/31/24 [phone]

## 2024-03-31 NOTE — ED Triage Notes (Signed)
 BIB EMS from home with left side pain that started last night. Nausea, vomiting and diarrhea since last night.  Denies any injury.   126/78 56  98% 110 cbg

## 2024-03-31 NOTE — ED Notes (Signed)
 Pt in lobby, irate, yelling, reports leaving due to wait times, encouragement and support provided, pt not willing to talk to this RN

## 2024-03-31 NOTE — ED Notes (Signed)
 Seen by Dr Zackowski for MSE

## 2024-03-31 NOTE — ED Triage Notes (Signed)
 Pt noted to be laying outside by ED staff who checked on pt, pt reporting he wants to be seen now for N/V, Noted pt left AMA earlier today.

## 2024-06-04 ENCOUNTER — Other Ambulatory Visit: Payer: Self-pay

## 2024-06-04 ENCOUNTER — Emergency Department (HOSPITAL_BASED_OUTPATIENT_CLINIC_OR_DEPARTMENT_OTHER)
Admission: EM | Admit: 2024-06-04 | Discharge: 2024-06-04 | Disposition: A | Discharge status: Home / Self Care | Payer: Self-pay | Attending: Emergency Medicine | Admitting: Emergency Medicine

## 2024-06-04 ENCOUNTER — Emergency Department (HOSPITAL_BASED_OUTPATIENT_CLINIC_OR_DEPARTMENT_OTHER): Payer: Self-pay

## 2024-06-04 ENCOUNTER — Encounter (HOSPITAL_BASED_OUTPATIENT_CLINIC_OR_DEPARTMENT_OTHER): Payer: Self-pay | Admitting: Emergency Medicine

## 2024-06-04 ENCOUNTER — Emergency Department (HOSPITAL_BASED_OUTPATIENT_CLINIC_OR_DEPARTMENT_OTHER): Payer: Self-pay | Admitting: Radiology

## 2024-06-04 DIAGNOSIS — R197 Diarrhea, unspecified: Secondary | ICD-10-CM | POA: Insufficient documentation

## 2024-06-04 DIAGNOSIS — I10 Essential (primary) hypertension: Secondary | ICD-10-CM | POA: Insufficient documentation

## 2024-06-04 DIAGNOSIS — M940 Chondrocostal junction syndrome [Tietze]: Secondary | ICD-10-CM | POA: Insufficient documentation

## 2024-06-04 DIAGNOSIS — R112 Nausea with vomiting, unspecified: Secondary | ICD-10-CM | POA: Insufficient documentation

## 2024-06-04 DIAGNOSIS — Z79899 Other long term (current) drug therapy: Secondary | ICD-10-CM | POA: Insufficient documentation

## 2024-06-04 LAB — CBC WITH DIFFERENTIAL/PLATELET
Abs Immature Granulocytes: 0.01 10*3/uL (ref 0.00–0.07)
Basophils Absolute: 0 10*3/uL (ref 0.0–0.1)
Basophils Relative: 1 %
Eosinophils Absolute: 0.2 10*3/uL (ref 0.0–0.5)
Eosinophils Relative: 3 %
HCT: 40.5 % (ref 39.0–52.0)
Hemoglobin: 14.1 g/dL (ref 13.0–17.0)
Immature Granulocytes: 0 %
Lymphocytes Relative: 26 %
Lymphs Abs: 1.7 10*3/uL (ref 0.7–4.0)
MCH: 31.2 pg (ref 26.0–34.0)
MCHC: 34.8 g/dL (ref 30.0–36.0)
MCV: 89.6 fL (ref 80.0–100.0)
Monocytes Absolute: 0.6 10*3/uL (ref 0.1–1.0)
Monocytes Relative: 9 %
Neutro Abs: 4.1 10*3/uL (ref 1.7–7.7)
Neutrophils Relative %: 61 %
Platelets: 276 10*3/uL (ref 150–400)
RBC: 4.52 MIL/uL (ref 4.22–5.81)
RDW: 13.7 % (ref 11.5–15.5)
WBC: 6.6 10*3/uL (ref 4.0–10.5)
nRBC: 0 % (ref 0.0–0.2)

## 2024-06-04 LAB — COMPREHENSIVE METABOLIC PANEL WITH GFR
ALT: 21 U/L (ref 0–44)
AST: 40 U/L (ref 15–41)
Albumin: 4.4 g/dL (ref 3.5–5.0)
Alkaline Phosphatase: 82 U/L (ref 38–126)
Anion gap: 9 (ref 5–15)
BUN: 16 mg/dL (ref 6–20)
CO2: 27 mmol/L (ref 22–32)
Calcium: 9.2 mg/dL (ref 8.9–10.3)
Chloride: 103 mmol/L (ref 98–111)
Creatinine, Ser: 1.16 mg/dL (ref 0.61–1.24)
GFR, Estimated: >60 mL/min (ref >60)
Glucose, Bld: 89 mg/dL (ref 70–99)
Potassium: 3.8 mmol/L (ref 3.5–5.1)
Sodium: 139 mmol/L (ref 135–145)
Total Bilirubin: 0.7 mg/dL (ref 0.0–1.2)
Total Protein: 7.2 g/dL (ref 6.5–8.1)

## 2024-06-04 LAB — TROPONIN T, HIGH SENSITIVITY: Troponin T High Sensitivity: <15 ng/L (ref <19)

## 2024-06-04 LAB — LIPASE, BLOOD: Lipase: 29 U/L (ref 11–51)

## 2024-06-04 MED ORDER — MAGNESIUM SULFATE IN D5W 1-5 GM/100ML-% IV SOLN
1.0000 g | Freq: Once | INTRAVENOUS | Status: AC
  Administered 2024-06-04: 1 g via INTRAVENOUS
  Filled 2024-06-04: qty 100

## 2024-06-04 MED ORDER — PANTOPRAZOLE SODIUM 40 MG IV SOLR
40.0000 mg | Freq: Once | INTRAVENOUS | Status: AC
  Administered 2024-06-04: 40 mg via INTRAVENOUS
  Filled 2024-06-04: qty 10

## 2024-06-04 MED ORDER — SODIUM CHLORIDE 0.9 % IV BOLUS
1000.0000 mL | Freq: Once | INTRAVENOUS | Status: AC
  Administered 2024-06-04: 1000 mL via INTRAVENOUS

## 2024-06-04 MED ORDER — MORPHINE SULFATE (PF) 4 MG/ML IV SOLN
4.0000 mg | Freq: Once | INTRAVENOUS | Status: AC
  Administered 2024-06-04: 4 mg via INTRAVENOUS
  Filled 2024-06-04: qty 1

## 2024-06-04 MED ORDER — DROPERIDOL 2.5 MG/ML IJ SOLN
0.6250 mg | Freq: Once | INTRAMUSCULAR | Status: AC
  Administered 2024-06-04: 0.625 mg via INTRAVENOUS
  Filled 2024-06-04: qty 2

## 2024-06-04 MED ORDER — DIPHENHYDRAMINE HCL 50 MG/ML IJ SOLN
12.5000 mg | Freq: Once | INTRAMUSCULAR | Status: AC
  Administered 2024-06-04: 12.5 mg via INTRAVENOUS
  Filled 2024-06-04: qty 1

## 2024-06-04 MED ORDER — ONDANSETRON 4 MG PO TBDP: 4.0000 mg | ORAL_TABLET | Freq: Three times a day (TID) | ORAL | 0 refills | Status: DC | PRN

## 2024-06-04 MED ORDER — ONDANSETRON HCL 4 MG/2ML IJ SOLN
4.0000 mg | Freq: Once | INTRAMUSCULAR | Status: AC
  Administered 2024-06-04: 4 mg via INTRAVENOUS
  Filled 2024-06-04: qty 2

## 2024-06-04 MED ORDER — METOCLOPRAMIDE HCL 5 MG/ML IJ SOLN
10.0000 mg | Freq: Once | INTRAMUSCULAR | Status: AC
  Administered 2024-06-04: 10 mg via INTRAVENOUS
  Filled 2024-06-04: qty 2

## 2024-06-04 MED ORDER — IOHEXOL 300 MG/ML  SOLN
100.0000 mL | Freq: Once | INTRAMUSCULAR | Status: AC | PRN
Start: 2024-06-04 — End: 2024-06-04
  Administered 2024-06-04: 90 mL via INTRAVENOUS

## 2024-06-04 NOTE — ED Triage Notes (Incomplete)
 Pt caox4, ambulatory c/o abd pain and n/v/d

## 2024-06-04 NOTE — ED Provider Notes (Signed)
 [facility] AT [facility] Provider Note   CSN: [id number] Arrival date & time: 06/04/24  9076     Patient presents with: Emesis   Bradley Arias is a 33 y.o. male with PMHx ADHD, anxiety, HTN who presents to ED concerned for nausea, vomiting, and diarrhea x2 days. Patient also endorsing constant left sided chest pain over the past 2 days that is reproducible to palpation. Patient also endorses subjective fevers. Endorses epigastric abdominal pain as well. Patient has not taken any OTC medications for his symptoms. Patient stating that this same symptom happened to him around 1 year ago and he came to ED but never received treatment d/t the wait times and ended up leaving and healing at home.   Denies cough, dysuria, hematuria.     Emesis      Prior to Admission medications   Medication Sig Start Date End Date Taking? Authorizing Provider  ondansetron  (ZOFRAN -ODT) 4 MG disintegrating tablet Take 1 tablet (4 mg total) by mouth every 8 (eight) hours as needed for nausea. 06/04/24  Yes Hoy Fraction F, PA-C  acetaminophen  (TYLENOL ) 500 MG tablet Take 2 tablets (1,000 mg total) by mouth every 6 (six) hours as needed for moderate pain. 04/27/22   Venter, Margaux, PA-C  amoxicillin -clavulanate (AUGMENTIN ) 875-125 MG tablet Take 1 tablet by mouth every 12 (twelve) hours. 04/27/22   Shepard, Margaux, PA-C  dicyclomine  (BENTYL ) 20 MG tablet Take 1 tablet (20 mg total) by mouth 2 (two) times daily. 09/18/20   Garrick Charleston, MD  HYDROcodone -acetaminophen  (NORCO) 5-325 MG per tablet Take 1 tablet by mouth every 6 (six) hours as needed for pain. Patient not taking: Reported on 09/18/2020 10/07/13   Kirichenko, Tatyana, PA-C  metoCLOPramide  (REGLAN ) 10 MG tablet Take 1 tablet (10 mg total) by mouth every 6 (six) hours. 04/27/22   Venter, Margaux, PA-C  ondansetron  (ZOFRAN ) 4 MG tablet Take 1 tablet (4 mg total) by mouth every 6 (six) hours. 01/30/23   Logan Ubaldo NOVAK, PA-C  pantoprazole  (PROTONIX ) 20 MG tablet Take 1 tablet (20 mg total) by mouth daily. 07/07/21   Levander Houston, MD    Allergies: Ibuprofen     Review of Systems  Gastrointestinal:  Positive for vomiting.    Updated Vital Signs BP (!) 137/90 (BP Location: Right Arm)   Pulse 60   Temp 98.5 F (36.9 C)   Resp 13   Ht 6' 1 (1.854 m)   Wt 83.9 kg   SpO2 100%   BMI 24.41 kg/m   Physical Exam Vitals and nursing note reviewed.  Constitutional:      General: He is not in acute distress.    Appearance: He is not ill-appearing or toxic-appearing.  HENT:     Head: Normocephalic and atraumatic.     Mouth/Throat:     Mouth: Mucous membranes are moist.   Eyes:     General: No scleral icterus.       Right eye: No discharge.        Left eye: No discharge.     Conjunctiva/sclera: Conjunctivae normal.    Cardiovascular:     Rate and Rhythm: Normal rate and regular rhythm.     Pulses: Normal pulses.     Heart sounds: Normal heart sounds. No murmur heard. Pulmonary:     Effort: Pulmonary effort is normal. No respiratory distress.     Breath sounds: Normal breath sounds. No wheezing, rhonchi or rales.  Abdominal:     General: Abdomen is flat. There  is no distension.     Palpations: Abdomen is soft. There is no mass.     Tenderness: There is abdominal tenderness.     Comments: Epigastric tenderness to palpation   Musculoskeletal:     Right lower leg: No edema.     Left lower leg: No edema.     Comments: Entire anterior chest wall is tender to palpation   Skin:    General: Skin is warm and dry.     Findings: No rash.   Neurological:     General: No focal deficit present.     Mental Status: He is alert and oriented to person, place, and time. Mental status is at baseline.   Psychiatric:        Mood and Affect: Mood normal.     (all labs ordered are listed, but only abnormal results are displayed) Labs Reviewed  COMPREHENSIVE METABOLIC PANEL WITH GFR  LIPASE,  BLOOD  CBC WITH DIFFERENTIAL/PLATELET  URINALYSIS, W/ REFLEX TO CULTURE (INFECTION SUSPECTED)  TROPONIN T, HIGH SENSITIVITY    EKG: EKG Interpretation Date/Time:  Friday June 04 2024 09:46:14 EDT Ventricular Rate:  55 PR Interval:  170 QRS Duration:  81 QT Interval:  473 QTC Calculation: 453 R Axis:   88  Text Interpretation: Sinus rhythm Probable anteroseptal infarct, old Artifact No significant change since last tracing Confirmed by Ellouise Fine ([id number]) on 06/04/2024 9:48:25 AM  Radiology: CT ABDOMEN PELVIS W CONTRAST Result Date: 06/04/2024 CLINICAL DATA:  Two day history of nausea and vomiting EXAM: CT ABDOMEN AND PELVIS WITH CONTRAST TECHNIQUE: Multidetector CT imaging of the abdomen and pelvis was performed using the standard protocol following bolus administration of intravenous contrast. RADIATION DOSE REDUCTION: This exam was performed according to the departmental dose-optimization program which includes automated exposure control, adjustment of the mA and/or kV according to patient size and/or use of iterative reconstruction technique. CONTRAST:  90mL OMNIPAQUE  IOHEXOL  300 MG/ML  SOLN COMPARISON:  CT abdomen and pelvis dated 04/22/2022, 07/07/2021 FINDINGS: Lower chest: No focal consolidation or pulmonary nodule in the lung bases. No pleural effusion or pneumothorax demonstrated. Partially imaged heart size is normal. Hepatobiliary: Unchanged scattered subcentimeter hypodensities, too small to characterize but likely cysts. 1.2 cm hypodensity in segment 5/8 (2:19) is increased in size from 0.6 cm. Mild periportal edema. No intra or extrahepatic biliary ductal dilation. Normal gallbladder. Pancreas: No focal lesions or main ductal dilation. Spleen: Normal in size without focal abnormality. Adrenals/Urinary Tract: No adrenal nodules. No suspicious renal mass, calculi or hydronephrosis. Subcentimeter left upper pole hypodensity (2:16), too small to characterize but unchanged from  07/07/2021, likely cyst. No focal bladder wall thickening. Stomach/Bowel: Normal appearance of the stomach. No evidence of bowel wall thickening, distention, or inflammatory changes. Normal appendix (2:60). Vascular/Lymphatic: No significant vascular findings are present. No enlarged abdominal or pelvic lymph nodes. Reproductive: Prostate is unremarkable. Other: Trace pelvic free fluid.  No free air or fluid collection. Musculoskeletal: No acute or abnormal lytic or blastic osseous lesions. IMPRESSION: 1. Mild periportal edema and trace pelvic free fluid, nonspecific but can be seen in the setting of fluid resuscitation. 2. Slight interval increase in size a hepatic segment 5/8 hypodensity, favored to represent a cyst. Electronically Signed   By: Limin  Xu M.D.   On: 06/04/2024 14:26   DG Chest 2 View Result Date: 06/04/2024 CLINICAL DATA:  Chest pain and emesis EXAM: CHEST - 2 VIEW COMPARISON:  Chest radiograph dated 03/31/2024 FINDINGS: Normal lung volumes. No focal consolidations. No pleural effusion  or pneumothorax. The heart size and mediastinal contours are within normal limits. No acute osseous abnormality. IMPRESSION: No active cardiopulmonary disease. Electronically Signed   By: Limin  Xu M.D.   On: 06/04/2024 10:14     Procedures   Medications Ordered in the ED  ondansetron  (ZOFRAN ) injection 4 mg (4 mg Intravenous Given 06/04/24 0953)  morphine (PF) 4 MG/ML injection 4 mg (4 mg Intravenous Given 06/04/24 0954)  sodium chloride  0.9 % bolus 1,000 mL (0 mLs Intravenous Stopped 06/04/24 1128)  pantoprazole  (PROTONIX ) injection 40 mg (40 mg Intravenous Given 06/04/24 1008)  ondansetron  (ZOFRAN ) injection 4 mg (4 mg Intravenous Given 06/04/24 1232)  metoCLOPramide  (REGLAN ) injection 10 mg (10 mg Intravenous Given 06/04/24 1307)  diphenhydrAMINE (BENADRYL) injection 12.5 mg (12.5 mg Intravenous Given 06/04/24 1307)  iohexol  (OMNIPAQUE ) 300 MG/ML solution 100 mL (90 mLs Intravenous Contrast Given  06/04/24 1401)  droperidol (INAPSINE) 2.5 MG/ML injection 0.625 mg (0.625 mg Intravenous Given 06/04/24 1547)  magnesium sulfate IVPB 1 g 100 mL (0 g Intravenous Stopped 06/04/24 1620)                                    Medical Decision Making Amount and/or Complexity of Data Reviewed Labs: ordered. Radiology: ordered.  Risk Prescription drug management.   This patient presents to the ED for concern of vomiting and chest pain, this involves an extensive number of treatment options, and is a complaint that carries with it a high risk of complications and morbidity.  The differential diagnosis includes acute viral gastroenteritis, UTI, appendicitis, cholecystitis,pancreatitis, nephrolithiasis, constipation, SBO, testicular/ovarian torsion.   Co morbidities that complicate the patient evaluation  ADHD, anxiety, HTN   Additional history obtained:  No PCP listed in chart   Problem List / ED Course / Critical interventions / Medication management  Patient presents to ED concern for nausea, vomiting, diarrhea, epigastric pain, chest pain x 2 days.  Physical exam with epigastric tenderness to palpation along with chest wall tenderness to palpation.  Rest of physical exam reassuring.  Patient afebrile with stable vitals. I Ordered, and personally interpreted labs.  CMP reassuring.  CBC without leukocytosis or anemia.  Lipase within normal limits.  Troponin within normal limits. The patient was maintained on a cardiac monitor.  I personally viewed and interpreted the cardiac monitored which showed an underlying rhythm of: Sinus rhythm without acute changes I ordered imaging studies including chest x-ray and CT abdomen/pelvis. I independently visualized and interpreted imaging which showed no acute process. I agree with the radiologist interpretation. It appears that patient's vomiting and epigastric pain is due to a viral gastroenteritis.  It appears that patient's chest pain is due to  costochondritis versus muscle strain. Provided patient with Protonix , morphine, Zofran , and IV fluids.  Patient still nauseated.  I did provided patient with Reglan  and Benadryl.  Patient still endorses nausea.  I then provided patient with droperidol and magnesium.  Patient stating that he feels a lot better and is ready to go home.  Patient now tolerating PO.  Patient to follow-up with PCP. I have reviewed the patients home medicines and have made adjustments as needed The patient has been appropriately medically screened and/or stabilized in the ED. I have low suspicion for any other emergent medical condition which would require further screening, evaluation or treatment in the ED or require inpatient management. At time of discharge the patient is hemodynamically stable and in no  acute distress. I have discussed work-up results and diagnosis with patient and answered all questions. Patient is agreeable with discharge plan. We discussed strict return precautions for returning to the emergency department and they verbalized understanding.     Social Determinants of Health:  none         Final diagnoses:  Nausea and vomiting, unspecified vomiting type  Costochondritis    ED Discharge Orders          Ordered    ondansetron  (ZOFRAN -ODT) 4 MG disintegrating tablet  Every 8 hours PRN        06/04/24 1709               Hoy Nidia FALCON, PA-C 06/04/24 1710    Kingsley, Victoria K, [place] 06/05/24 [phone]

## 2024-06-04 NOTE — ED Notes (Signed)
Water and saltine crackers given for PO challenge

## 2024-06-04 NOTE — ED Triage Notes (Signed)
 Pt POV from home c/o N/V x 2 days. Also c/o non productive cough and chest pain made worse by coughing/vomiting. NAD during triage

## 2024-06-04 NOTE — Discharge Instructions (Addendum)
I am glad you are feeling better. Please follow up with your primary care provider. Seek emergency care if experiencing any new or worsening symptoms.

## 2024-06-04 NOTE — ED Notes (Signed)
 Pt still advising he's unable to provide urine sample

## 2024-06-04 NOTE — ED Notes (Signed)
Pt still unable to provide a urine sample 

## 2024-08-08 ENCOUNTER — Emergency Department (HOSPITAL_COMMUNITY)
Admission: EM | Admit: 2024-08-08 | Discharge: 2024-08-09 | Disposition: A | Discharge status: Home / Self Care | Payer: Self-pay | Attending: Emergency Medicine | Admitting: Emergency Medicine

## 2024-08-08 DIAGNOSIS — K0889 Other specified disorders of teeth and supporting structures: Secondary | ICD-10-CM | POA: Insufficient documentation

## 2024-08-08 DIAGNOSIS — S93105A Unspecified dislocation of left toe(s), initial encounter: Secondary | ICD-10-CM

## 2024-08-08 DIAGNOSIS — X58XXXA Exposure to other specified factors, initial encounter: Secondary | ICD-10-CM | POA: Insufficient documentation

## 2024-08-08 DIAGNOSIS — S93115A Dislocation of interphalangeal joint of left lesser toe(s), initial encounter: Secondary | ICD-10-CM | POA: Insufficient documentation

## 2024-08-08 DIAGNOSIS — F17290 Nicotine dependence, other tobacco product, uncomplicated: Secondary | ICD-10-CM | POA: Insufficient documentation

## 2024-08-08 DIAGNOSIS — I10 Essential (primary) hypertension: Secondary | ICD-10-CM | POA: Insufficient documentation

## 2024-08-08 NOTE — ED Triage Notes (Signed)
 Pt POV coming for multiple complaints. Tooth abscess that started yesterday, significantly swelled on the left lower jaw, decayed teeth.   Pain on left 4th toe, visibly swelling, report landing wrong on left foot.   Pain 10 out 10, alert and oriented on triage

## 2024-08-09 ENCOUNTER — Emergency Department (HOSPITAL_COMMUNITY): Payer: Self-pay

## 2024-08-09 MED ORDER — AMOXICILLIN 500 MG PO CAPS: 500.0000 mg | ORAL_CAPSULE | Freq: Three times a day (TID) | ORAL | 0 refills | Status: AC

## 2024-08-09 MED ORDER — OXYCODONE HCL 5 MG PO TABS
5.0000 mg | ORAL_TABLET | Freq: Once | ORAL | Status: AC
  Administered 2024-08-09: 5 mg via ORAL
  Filled 2024-08-09: qty 1

## 2024-08-09 MED ORDER — AMOXICILLIN 500 MG PO CAPS
500.0000 mg | ORAL_CAPSULE | Freq: Once | ORAL | Status: AC
  Administered 2024-08-09: 500 mg via ORAL
  Filled 2024-08-09: qty 1

## 2024-08-09 NOTE — Discharge Instructions (Signed)
 You were evaluated in the Emergency Department and after careful evaluation, we did not find any emergent condition requiring admission or further testing in the hospital.  Your exam/testing today is overall reassuring.  We reduced your toe here in the emergency department.  We also suspected tooth infection.  Take the amoxicillin  antibiotic as directed.  Continue Tylenol  and Motrin  for discomfort.  Can follow-up with the orthopedic specialist if your toe continues to bother you.  Please return to the Emergency Department if you experience any worsening of your condition.   Thank you for allowing us  to be a part of your care.

## 2024-08-09 NOTE — ED Provider Notes (Signed)
 [facility]EMERGENCY DEPT [facility] Emergency Department Provider Note MRN:  [id number]  Arrival date & time: 08/09/24     Chief Complaint   Toe pain History of Present Illness   Bradley Arias is a 33 y.o. year-old male with a history of hypertension presenting to the ED with chief complaint of toe pain.  Left fourth toe pain, not sure how he injured it but he thinks it is broken.  Also having pain to the left lower teeth.  Denies fever, no other injuries or complaints.  Review of Systems  A thorough review of systems was obtained and all systems are negative except as noted in the HPI and PMH.   Patient's Health History    Past Medical History:  Diagnosis Date   ADHD (attention deficit hyperactivity disorder)    Anxiety    Hypertension     No past surgical history on file.  No family history on file.  Social History   Socioeconomic History   Marital status: Single    Spouse name: Not on file   Number of children: Not on file   Years of education: Not on file   Highest education level: Not on file  Occupational History   Not on file  Tobacco Use   Smoking status: Some Days    Types: Cigars    Passive exposure: Current   Smokeless tobacco: Never  Vaping Use   Vaping status: Never Used  Substance and Sexual Activity   Alcohol use: Yes   Drug use: No   Sexual activity: Not on file  Other Topics Concern   Not on file  Social History Narrative   Not on file   Social Drivers of Health   Financial Resource Strain: Not on file  Food Insecurity: Not on file  Transportation Needs: Not on file  Physical Activity: Not on file  Stress: Not on file  Social Connections: Not on file  Intimate Partner Violence: Not on file     Physical Exam   Vitals:   08/08/24 2329 08/09/24 0253  BP: (!) 132/91 135/86  Pulse: 64 78  Resp: 16 20  Temp: 98.4 F (36.9 C) 98.6 F (37 C)  SpO2: 99% 100%    CONSTITUTIONAL: Well-appearing, NAD NEURO/PSYCH:  Alert and  oriented x 3, no focal deficits EYES:  eyes equal and reactive ENT/NECK:  no LAD, no JVD CARDIO: Regular rate, well-perfused, normal S1 and S2 PULM:  CTAB no wheezing or rhonchi GI/GU:  non-distended, non-tender MSK/SPINE:  No gross deformities, no edema SKIN:  no rash   *Additional and/or pertinent findings included in MDM below  Diagnostic and Interventional Summary    EKG Interpretation Date/Time:    Ventricular Rate:    PR Interval:    QRS Duration:    QT Interval:    QTC Calculation:   R Axis:      Text Interpretation:         Labs Reviewed - No data to display  DG Toe 4th Left  Final Result      Medications  oxyCODONE  (Oxy IR/ROXICODONE ) immediate release tablet 5 mg (5 mg Oral Given 08/09/24 0105)  amoxicillin  (AMOXIL ) capsule 500 mg (500 mg Oral Given 08/09/24 0106)     Procedures  /  Critical Care .Reduction of dislocation  Date/Time: 08/09/2024 2:53 AM  Performed by: Theadore Ozell HERO, MD Authorized by: Theadore Ozell HERO, MD  Consent: Verbal consent obtained Consent given by: patient Imaging studies: imaging studies available Time out: Immediately prior to  procedure a time out was called to verify the correct patient, procedure, equipment, support staff and site/side marked as required. Local anesthesia used: no  Anesthesia: Local anesthesia used: no  Sedation: Patient sedated: no  Patient tolerance: patient tolerated the procedure well with no immediate complications Comments: Left fourth toe reduction using mild traction.     ED Course and Medical Decision Making  Initial Impression and Ddx Left fourth toe is swollen and tender, he reports trauma but does not remember exactly what happened.  Doubt infectious process.  Awaiting x-ray.  Significant dental decay, no signs of abscess or more significant contiguous infection.  No systemic symptoms.  Past medical/surgical history that increases complexity of ED encounter: None  Interpretation of  Diagnostics I personally reviewed the toe x-ray and my interpretation is as follows: Toe x-ray  Dislocation  Patient Reassessment and Ultimate Disposition/Management     Toe reduced and anatomical alignment achieved.  Appropriate for discharge.  Patient management required discussion with the following services or consulting groups:  None  Complexity of Problems Addressed Acute complicated illness or Injury  Additional Data Reviewed and Analyzed Further history obtained from: None  Additional Factors Impacting ED Encounter Risk Prescriptions  Ozell HERO. Theadore, MD [facility] Emergency Medicine [facility] [email or web address]  Final Clinical Impressions(s) / ED Diagnoses     ICD-10-CM   1. Dislocation of phalanx of left foot, initial encounter  S93.105A     2. Tooth pain  K08.89       ED Discharge Orders          Ordered    amoxicillin  (AMOXIL ) 500 MG capsule  3 times daily        08/09/24 0251             Discharge Instructions Discussed with and Provided to Patient:     Discharge Instructions      You were evaluated in the Emergency Department and after careful evaluation, we did not find any emergent condition requiring admission or further testing in the hospital.  Your exam/testing today is overall reassuring.  We reduced your toe here in the emergency department.  We also suspected tooth infection.  Take the amoxicillin  antibiotic as directed.  Continue Tylenol  and Motrin  for discomfort.  Can follow-up with the orthopedic specialist if your toe continues to bother you.  Please return to the Emergency Department if you experience any worsening of your condition.   Thank you for allowing us  to be a part of your care.       Theadore Ozell HERO, MD 08/09/24 [phone]

## 2024-09-02 ENCOUNTER — Encounter (HOSPITAL_COMMUNITY): Payer: Self-pay

## 2024-09-02 ENCOUNTER — Emergency Department (HOSPITAL_COMMUNITY)
Admission: EM | Admit: 2024-09-02 | Discharge: 2024-09-02 | Disposition: A | Discharge status: Home / Self Care | Payer: Self-pay | Attending: Emergency Medicine | Admitting: Emergency Medicine

## 2024-09-02 ENCOUNTER — Other Ambulatory Visit: Payer: Self-pay

## 2024-09-02 DIAGNOSIS — R197 Diarrhea, unspecified: Secondary | ICD-10-CM | POA: Insufficient documentation

## 2024-09-02 DIAGNOSIS — R112 Nausea with vomiting, unspecified: Secondary | ICD-10-CM | POA: Insufficient documentation

## 2024-09-02 DIAGNOSIS — K089 Disorder of teeth and supporting structures, unspecified: Secondary | ICD-10-CM | POA: Insufficient documentation

## 2024-09-02 LAB — URINALYSIS, ROUTINE W REFLEX MICROSCOPIC
Bilirubin Urine: NEGATIVE
Glucose, UA: NEGATIVE mg/dL
Hgb urine dipstick: NEGATIVE
Ketones, ur: NEGATIVE mg/dL
Leukocytes,Ua: NEGATIVE
Nitrite: NEGATIVE
Protein, ur: NEGATIVE mg/dL
Specific Gravity, Urine: 1.008 (ref 1.005–1.030)
pH: 5 (ref 5.0–8.0)

## 2024-09-02 LAB — CBC WITH DIFFERENTIAL/PLATELET
Abs Immature Granulocytes: 0.01 K/uL (ref 0.00–0.07)
Basophils Absolute: 0.1 K/uL (ref 0.0–0.1)
Basophils Relative: 1 %
Eosinophils Absolute: 0.2 K/uL (ref 0.0–0.5)
Eosinophils Relative: 3 %
HCT: 38.9 % — ABNORMAL LOW (ref 39.0–52.0)
Hemoglobin: 13.1 g/dL (ref 13.0–17.0)
Immature Granulocytes: 0 %
Lymphocytes Relative: 31 %
Lymphs Abs: 2 K/uL (ref 0.7–4.0)
MCH: 30.5 pg (ref 26.0–34.0)
MCHC: 33.7 g/dL (ref 30.0–36.0)
MCV: 90.7 fL (ref 80.0–100.0)
Monocytes Absolute: 0.8 K/uL (ref 0.1–1.0)
Monocytes Relative: 12 %
Neutro Abs: 3.5 K/uL (ref 1.7–7.7)
Neutrophils Relative %: 53 %
Platelets: 282 K/uL (ref 150–400)
RBC: 4.29 MIL/uL (ref 4.22–5.81)
RDW: 14.1 % (ref 11.5–15.5)
WBC: 6.5 K/uL (ref 4.0–10.5)
nRBC: 0 % (ref 0.0–0.2)

## 2024-09-02 LAB — COMPREHENSIVE METABOLIC PANEL WITH GFR
ALT: 20 U/L (ref 0–44)
AST: 18 U/L (ref 15–41)
Albumin: 3.5 g/dL (ref 3.5–5.0)
Alkaline Phosphatase: 55 U/L (ref 38–126)
Anion gap: 9 (ref 5–15)
BUN: 10 mg/dL (ref 6–20)
CO2: 26 mmol/L (ref 22–32)
Calcium: 9 mg/dL (ref 8.9–10.3)
Chloride: 106 mmol/L (ref 98–111)
Creatinine, Ser: 1.19 mg/dL (ref 0.61–1.24)
GFR, Estimated: >60 mL/min (ref >60)
Glucose, Bld: 81 mg/dL (ref 70–99)
Potassium: 4.3 mmol/L (ref 3.5–5.1)
Sodium: 141 mmol/L (ref 135–145)
Total Bilirubin: 0.6 mg/dL (ref 0.0–1.2)
Total Protein: 6.1 g/dL — ABNORMAL LOW (ref 6.5–8.1)

## 2024-09-02 LAB — LIPASE, BLOOD: Lipase: 40 U/L (ref 11–51)

## 2024-09-02 MED ORDER — ONDANSETRON 4 MG PO TBDP
4.0000 mg | ORAL_TABLET | Freq: Once | ORAL | Status: AC
  Administered 2024-09-02: 4 mg via ORAL
  Filled 2024-09-02: qty 1

## 2024-09-02 MED ORDER — ONDANSETRON HCL 4 MG PO TABS: 4.0000 mg | ORAL_TABLET | Freq: Three times a day (TID) | ORAL | 0 refills | Status: AC | PRN

## 2024-09-02 NOTE — ED Provider Triage Note (Signed)
 Emergency Medicine Provider Triage Evaluation Note  Keiji Melland , a 33 y.o. male  was evaluated in triage.  Pt complains of patient here with nausea vomiting and diarrhea after recent starting antibiotics for dental infection.  Feels like this may be food poisoning.  Unable to complete antibiotic course for dental infection due to vomiting..  Review of Systems  Positive: Abdominal pain Negative: Fever  Physical Exam  BP (!) 140/78   Pulse 81   Temp 98.4 F (36.9 C)   Resp 18   Ht 6' 1 (1.854 m)   Wt 83.9 kg   SpO2 99%   BMI 24.41 kg/m  Gen:   Awake, no distress   Resp:  Normal effort  MSK:   Moves extremities without difficulty  Other:    Medical Decision Making  Medically screening exam initiated at 4:19 PM.  Appropriate orders placed.  Watson Manfredi was informed that the remainder of the evaluation will be completed by another provider, this initial triage assessment does not replace that evaluation, and the importance of remaining in the ED until their evaluation is complete.     Arloa Chroman, PA-C 09/02/24 1620

## 2024-09-02 NOTE — Discharge Instructions (Signed)
Get help right away if: You have pain in your chest, neck, arm, or jaw. You feel extremely weak or you faint. You have persistent vomiting. You have vomit that is bright red or looks like black coffee grounds. You have bloody or black stools (feces) or stools that look like tar. You have a severe headache, a stiff neck, or both. You have severe pain, cramping, or bloating in your abdomen. You have difficulty breathing, or you are breathing very quickly. Your heart is beating very quickly. Your skin feels cold and clammy. You feel confused. You have signs of dehydration, such as: Dark urine, very little urine, or no urine. Cracked lips. Dry mouth. Sunken eyes. Sleepiness. Weakness. These symptoms may be an emergency. Get help right away. Call 911. Do not wait to see if the symptoms will go away. Do not drive yourself to the hospital. 

## 2024-09-02 NOTE — ED Triage Notes (Signed)
 Pt is here for food poisoning.  He reports nausea and vomiting since yesterday.  Ambulatory to registration

## 2024-09-02 NOTE — ED Triage Notes (Signed)
 Reports vomting diarrhea since Friday unsure if from food poisoning.  Also reports hx of gastritis and ulcers and reports also having dental abscess.  Poor dental hygiene.

## 2024-09-02 NOTE — ED Notes (Signed)
 Pt came up to quick triage and became very agitated with staff. Pt reports I have to pick up my kids, I need a work note,I have to leave to go to work.

## 2024-09-02 NOTE — ED Provider Notes (Signed)
 [place] EMERGENCY DEPARTMENT AT [facility] Provider Note   CSN: [id number] Arrival date & time: 09/02/24  1503     Patient presents with: Emesis   Bradley Arias is a 33 y.o. male who complains of patient here with nausea vomiting and diarrhea after recent starting antibiotics for dental infection.  Feels like this may be food poisoning.  Unable to complete antibiotic course for dental infection due to vomiting..      Emesis      Prior to Admission medications   Medication Sig Start Date End Date Taking? Authorizing Provider  acetaminophen  (TYLENOL ) 500 MG tablet Take 2 tablets (1,000 mg total) by mouth every 6 (six) hours as needed for moderate pain. 04/27/22   Venter, Margaux, PA-C  amoxicillin -clavulanate (AUGMENTIN ) 875-125 MG tablet Take 1 tablet by mouth every 12 (twelve) hours. 04/27/22   Venter, Margaux, PA-C  dicyclomine  (BENTYL ) 20 MG tablet Take 1 tablet (20 mg total) by mouth 2 (two) times daily. 09/18/20   Garrick Charleston, MD  HYDROcodone -acetaminophen  (NORCO) 5-325 MG per tablet Take 1 tablet by mouth every 6 (six) hours as needed for pain. Patient not taking: Reported on 09/18/2020 10/07/13   Kirichenko, Tatyana, PA-C  metoCLOPramide  (REGLAN ) 10 MG tablet Take 1 tablet (10 mg total) by mouth every 6 (six) hours. 04/27/22   Venter, Margaux, PA-C  ondansetron  (ZOFRAN ) 4 MG tablet Take 1 tablet (4 mg total) by mouth every 6 (six) hours. 01/30/23   Logan Ubaldo NOVAK, PA-C  ondansetron  (ZOFRAN -ODT) 4 MG disintegrating tablet Take 1 tablet (4 mg total) by mouth every 8 (eight) hours as needed for nausea. 06/04/24   Hoy Nidia FALCON, PA-C  pantoprazole  (PROTONIX ) 20 MG tablet Take 1 tablet (20 mg total) by mouth daily. 07/07/21   Levander Houston, MD    Allergies: Ibuprofen     Review of Systems  Gastrointestinal:  Positive for vomiting.    Updated Vital Signs BP (!) 140/78   Pulse 81   Temp 98.4 F (36.9 C)   Resp 18   Ht 6' 1 (1.854 m)   Wt 83.9  kg   SpO2 99%   BMI 24.41 kg/m   Physical Exam Vitals and nursing note reviewed.  Constitutional:      General: He is not in acute distress.    Appearance: He is well-developed. He is not diaphoretic.  HENT:     Head: Normocephalic and atraumatic.     Comments: Poor dentition Eyes:     General: No scleral icterus.    Conjunctiva/sclera: Conjunctivae normal.  Cardiovascular:     Rate and Rhythm: Normal rate and regular rhythm.     Heart sounds: Normal heart sounds.  Pulmonary:     Effort: Pulmonary effort is normal. No respiratory distress.     Breath sounds: Normal breath sounds.  Abdominal:     Palpations: Abdomen is soft.     Tenderness: There is no abdominal tenderness.  Musculoskeletal:     Cervical back: Normal range of motion and neck supple.  Skin:    General: Skin is warm and dry.  Neurological:     Mental Status: He is alert.  Psychiatric:        Behavior: Behavior normal.     (all labs ordered are listed, but only abnormal results are displayed) Labs Reviewed  CBC WITH DIFFERENTIAL/PLATELET - Abnormal; Notable for the following components:      Result Value   HCT 38.9 (*)    All other components within normal limits  COMPREHENSIVE METABOLIC PANEL WITH GFR - Abnormal; Notable for the following components:   Total Protein 6.1 (*)    All other components within normal limits  URINALYSIS, ROUTINE W REFLEX MICROSCOPIC - Abnormal; Notable for the following components:   Color, Urine STRAW (*)    All other components within normal limits  LIPASE, BLOOD    EKG: None  Radiology: No results found.   Procedures   Medications Ordered in the ED  ondansetron  (ZOFRAN -ODT) disintegrating tablet 4 mg (4 mg Oral Given 09/02/24 1630)                                    Medical Decision Making Amount and/or Complexity of Data Reviewed Labs: ordered.  Risk Prescription drug management.   Patient here for nausea and vomiting after starting an  antibiotic.The emergent differential diagnosis for vomiting includes, but is not limited to ACS/MI, DKA, Ischemic bowel, Meningitis, Sepsis, Acute gastric dilation, Adrenal insufficiency, Appendicitis,  Bowel obstruction/ileus, Carbon monoxide poisoning, Cholecystitis, Electrolyte abnormalities, Elevated ICP, Gastric outlet obstruction, Pancreatitis, Ruptured viscus, Biliary colic, Cannabinoid hyperemesis syndrome, Gastritis, Gastroenteritis, Gastroparesis,  Narcotic withdrawal, Peptic ulcer disease, and UTI   Ordered and reviewed patient's labs, no acute findings.  He was given Zofran  with improvement in his nausea and has no further vomiting.  Patient is feeling improved.  Feel he is safe for discharge at this time.  He has no signs or symptoms of Ludwig's angina, no obvious signs of abscess in the mouth.  Appropriate for discharge at this time with strict return precautions.     Final diagnoses:  None    ED Discharge Orders     None          Arloa Chroman, PA-C 09/02/24 2050    Emil Share, DO 09/02/24 2202

## 2024-09-02 NOTE — ED Notes (Signed)
Pt ambulated from ed with steady gait.
